# Patient Record
Sex: Female | Born: 1960 | Race: Black or African American | Hispanic: No | Marital: Married | Smoking: Former smoker
Health system: Southern US, Community
[De-identification: ages and names within clinical notes are randomized; demographics above are authoritative.]

## PROBLEM LIST (undated history)

## (undated) DIAGNOSIS — G4730 Sleep apnea, unspecified: Secondary | ICD-10-CM

## (undated) DIAGNOSIS — R7303 Prediabetes: Secondary | ICD-10-CM

## (undated) DIAGNOSIS — M1990 Unspecified osteoarthritis, unspecified site: Secondary | ICD-10-CM

## (undated) DIAGNOSIS — G5600 Carpal tunnel syndrome, unspecified upper limb: Secondary | ICD-10-CM

## (undated) DIAGNOSIS — G8929 Other chronic pain: Secondary | ICD-10-CM

## (undated) DIAGNOSIS — K219 Gastro-esophageal reflux disease without esophagitis: Secondary | ICD-10-CM

## (undated) DIAGNOSIS — M545 Low back pain, unspecified: Secondary | ICD-10-CM

## (undated) DIAGNOSIS — R011 Cardiac murmur, unspecified: Secondary | ICD-10-CM

## (undated) HISTORY — PX: BARIATRIC SURGERY: SHX1103

## (undated) HISTORY — PX: ABDOMINAL HYSTERECTOMY: SHX81

## (undated) HISTORY — PX: CARPAL TUNNEL RELEASE: SHX101

## (undated) HISTORY — PX: TUBAL LIGATION: SHX77

## (undated) HISTORY — PX: BUNIONECTOMY: SHX129

## (undated) HISTORY — PX: REPLACEMENT TOTAL KNEE BILATERAL: SUR1225

## (undated) HISTORY — PX: PARTIAL HYSTERECTOMY: SHX80

---

## 2013-09-21 ENCOUNTER — Emergency Department (HOSPITAL_COMMUNITY)
Admission: EM | Admit: 2013-09-21 | Discharge: 2013-09-21 | Disposition: A | Discharge status: Home / Self Care | Payer: BC Managed Care – PPO | Attending: Emergency Medicine | Admitting: Emergency Medicine

## 2013-09-21 ENCOUNTER — Emergency Department (HOSPITAL_COMMUNITY): Payer: BC Managed Care – PPO

## 2013-09-21 DIAGNOSIS — Z791 Long term (current) use of non-steroidal anti-inflammatories (NSAID): Secondary | ICD-10-CM | POA: Insufficient documentation

## 2013-09-21 DIAGNOSIS — R079 Chest pain, unspecified: Secondary | ICD-10-CM | POA: Diagnosis present

## 2013-09-21 DIAGNOSIS — R51 Headache: Secondary | ICD-10-CM | POA: Diagnosis not present

## 2013-09-21 DIAGNOSIS — Z79899 Other long term (current) drug therapy: Secondary | ICD-10-CM | POA: Diagnosis not present

## 2013-09-21 DIAGNOSIS — R0789 Other chest pain: Secondary | ICD-10-CM | POA: Diagnosis not present

## 2013-09-21 LAB — CBC
HEMATOCRIT: 34.7 % — AB (ref 36.0–46.0)
HEMOGLOBIN: 11.9 g/dL — AB (ref 12.0–15.0)
MCH: 28.5 pg (ref 26.0–34.0)
MCHC: 34.3 g/dL (ref 30.0–36.0)
MCV: 83.2 fL (ref 78.0–100.0)
Platelets: 217 10*3/uL (ref 150–400)
RBC: 4.17 MIL/uL (ref 3.87–5.11)
RDW: 14.6 % (ref 11.5–15.5)
WBC: 14.9 10*3/uL — AB (ref 4.0–10.5)

## 2013-09-21 LAB — COMPREHENSIVE METABOLIC PANEL
ALBUMIN: 3.8 g/dL (ref 3.5–5.2)
ALT: 27 U/L (ref 0–35)
ANION GAP: 8 (ref 5–15)
AST: 26 U/L (ref 0–37)
Alkaline Phosphatase: 100 U/L (ref 39–117)
BILIRUBIN TOTAL: 0.4 mg/dL (ref 0.3–1.2)
BUN: 15 mg/dL (ref 6–23)
CHLORIDE: 105 meq/L (ref 96–112)
CO2: 28 mEq/L (ref 19–32)
Calcium: 9.3 mg/dL (ref 8.4–10.5)
Creatinine, Ser: 0.58 mg/dL (ref 0.50–1.10)
GFR calc non Af Amer: >90 mL/min (ref >90)
GLUCOSE: 106 mg/dL — AB (ref 70–99)
POTASSIUM: 4.2 meq/L (ref 3.7–5.3)
Sodium: 141 mEq/L (ref 137–147)
Total Protein: 7.8 g/dL (ref 6.0–8.3)

## 2013-09-21 LAB — I-STAT TROPONIN, ED: Troponin i, poc: 0 ng/mL (ref 0.00–0.08)

## 2013-09-21 LAB — TROPONIN I: Troponin I: <0.3 ng/mL (ref <0.30)

## 2013-09-21 MED ORDER — MORPHINE SULFATE 4 MG/ML IJ SOLN
4.0000 mg | Freq: Once | INTRAMUSCULAR | Status: AC
  Administered 2013-09-21: 4 mg via INTRAVENOUS
  Filled 2013-09-21: qty 1

## 2013-09-21 MED ORDER — FAMOTIDINE IN NACL 20-0.9 MG/50ML-% IV SOLN
20.0000 mg | Freq: Once | INTRAVENOUS | Status: AC
  Administered 2013-09-21: 20 mg via INTRAVENOUS
  Filled 2013-09-21: qty 50

## 2013-09-21 MED ORDER — ONDANSETRON HCL 4 MG/2ML IJ SOLN
4.0000 mg | Freq: Once | INTRAMUSCULAR | Status: AC
  Administered 2013-09-21: 4 mg via INTRAVENOUS
  Filled 2013-09-21: qty 2

## 2013-09-21 NOTE — ED Provider Notes (Signed)
Care assumed from [place], PA-C at shift change. Pt with chest pain, nausea and diaphoresis, resolved with nitro. Workup negative. Plan to obtain delta troponin at 4:30. If negative, d/c home. 6:18 PM Delta troponin negative. Patient reports her chest pain has not returned. States she is hungry. Stable for discharge. Followup with PCP. Return precautions given. Patient states understanding of treatment care plan and is agreeable.  Results for orders placed during the hospital encounter of 09/21/13  CBC      Result Value Ref Range   WBC 14.9 (*) 4.0 - 10.5 K/uL   RBC 4.17  3.87 - 5.11 MIL/uL   Hemoglobin 11.9 (*) 12.0 - 15.0 g/dL   HCT 08.6 (*) 57.8 - 46.9 %   MCV 83.2  78.0 - 100.0 fL   MCH 28.5  26.0 - 34.0 pg   MCHC 34.3  30.0 - 36.0 g/dL   RDW 62.9  52.8 - 41.3 %   Platelets 217  150 - 400 K/uL  COMPREHENSIVE METABOLIC PANEL      Result Value Ref Range   Sodium 141  137 - 147 mEq/L   Potassium 4.2  3.7 - 5.3 mEq/L   Chloride 105  96 - 112 mEq/L   CO2 28  19 - 32 mEq/L   Glucose, Bld 106 (*) 70 - 99 mg/dL   BUN 15  6 - 23 mg/dL   Creatinine, Ser 2.44  0.50 - 1.10 mg/dL   Calcium 9.3  8.4 - 01.0 mg/dL   Total Protein 7.8  6.0 - 8.3 g/dL   Albumin 3.8  3.5 - 5.2 g/dL   AST 26  0 - 37 U/L   ALT 27  0 - 35 U/L   Alkaline Phosphatase 100  39 - 117 U/L   Total Bilirubin 0.4  0.3 - 1.2 mg/dL   GFR calc non Af Amer >90  >90 mL/min   GFR calc Af Amer >90  >90 mL/min   Anion gap 8  5 - 15  TROPONIN I      Result Value Ref Range   Troponin I <0.30  <0.30 ng/mL  I-STAT TROPOININ, ED      Result Value Ref Range   Troponin i, poc 0.00  0.00 - 0.08 ng/mL   Comment 3              Trevor Mace, [place] 09/21/13 1819

## 2013-09-21 NOTE — ED Provider Notes (Signed)
CSN: [id number]     Arrival date & time 09/21/13  1302 History   First MD Initiated Contact with Patient 09/21/13 1329     Chief Complaint  Patient presents with  . Chest Pain     (Consider location/radiation/quality/duration/timing/severity/associated sxs/prior Treatment) HPI Colleen Petersen Is a 53 year old female who presents to the emergency department with chief complaint of chest pain. The patient states that this morning she had a mild headache. She ate about 8 and a banana she thought her headache might be related to the fact that she hadn't eaten. Patient states that she was driving from [address] mucoid en route and during that time had onset of severe substernal squeezing chest pain which radiated up to her throat. She felt as if she did squeezing in her throat. The patient swallowed, try to find positions of comfort and could not become comfortable. Patient states when she got to nuclear chief in a parking lot. The patient states she became diaphoretic and had one episode of vomiting. RF include obestiy. No other RF.    No past medical history on file. No past surgical history on file. No family history on file. History  Substance Use Topics  . Smoking status: Not on file  . Smokeless tobacco: Not on file  . Alcohol Use: Not on file   OB History   No data available     Review of Systems  Ten systems reviewed and are negative for acute change, except as noted in the HPI.    Allergies  Review of patient's allergies indicates not on file.  Home Medications   Prior to Admission medications   Medication Sig Start Date End Date Taking? Authorizing Provider  hydrOXYzine (ATARAX/VISTARIL) 25 MG tablet Take 25 mg by mouth at bedtime as needed for itching.   Yes Historical Provider, MD  KRILL OIL PO Take 1 tablet by mouth daily.   Yes Historical Provider, MD  meloxicam (MOBIC) 15 MG tablet Take 15 mg by mouth daily.   Yes Historical Provider, MD   There were no  vitals taken for this visit. Physical Exam  Constitutional: She is oriented to person, place, and time. She appears well-developed and well-nourished. No distress.  HENT:  Head: Normocephalic and atraumatic.  Eyes: Conjunctivae are normal. No scleral icterus.  Neck: Normal range of motion.  Cardiovascular: Normal rate, regular rhythm and normal heart sounds.  Exam reveals no gallop and no friction rub.   No murmur heard. Pulmonary/Chest: Effort normal and breath sounds normal. No respiratory distress.  Abdominal: Soft. Bowel sounds are normal. She exhibits no distension and no mass. There is no tenderness. There is no guarding.  Neurological: She is alert and oriented to person, place, and time.  Skin: Skin is warm and dry. She is not diaphoretic.    ED Course  Procedures (including critical care time) Labs Review Labs Reviewed  CBC  COMPREHENSIVE METABOLIC PANEL  I-STAT TROPOININ, ED    Imaging Review No results found.   EKG Interpretation None      MDM   Final diagnoses:  None    Patient with Minimal rf.I feel her symptoms sound more GI in nature. Perhaps, gerd or espohageal spasm I have discssed the case with my attending physcican Dr. Rosalia Hammers who feels that delta troponin is appropriate. I have given report to PA Mathis Fare who will assume care.    Arthor Captain, PA-C 09/21/13 [phone]

## 2013-09-21 NOTE — Discharge Instructions (Signed)

## 2013-09-21 NOTE — ED Notes (Signed)
To ED via [facility] with c/o chest pain that started at approx 1200, pt had a headache last night, went to work today with h/a-- ate breakfast around 1000 thinking it would make her feel better. Had to go to [employer] for work, and while on the way from one to the other, started having mid sternal chest pain. Vomited x 1, became diaphoretic, received  ASA and NTG x 1 with relief of chest pain. Currently has h/a 8/10.

## 2013-09-22 NOTE — ED Provider Notes (Signed)
History/physical exam/procedure(s) were performed by non-physician practitioner and as supervising physician I was immediately available for consultation/collaboration. I have reviewed all notes and am in agreement with care and plan.   Iain Sawchuk S Tifini Reeder, MD 09/22/13 1008 

## 2013-09-22 NOTE — ED Provider Notes (Signed)
Medical screening examination/treatment/procedure(s) were performed by non-physician practitioner and as supervising physician I was immediately available for consultation/collaboration.  Antoneo Ghrist, MD 09/22/13 1008 

## 2016-01-02 ENCOUNTER — Encounter (HOSPITAL_BASED_OUTPATIENT_CLINIC_OR_DEPARTMENT_OTHER): Payer: Self-pay | Admitting: Emergency Medicine

## 2016-01-02 ENCOUNTER — Emergency Department (HOSPITAL_BASED_OUTPATIENT_CLINIC_OR_DEPARTMENT_OTHER)
Admission: EM | Admit: 2016-01-02 | Discharge: 2016-01-02 | Disposition: A | Discharge status: Home / Self Care | Payer: BC Managed Care – PPO | Attending: Emergency Medicine | Admitting: Emergency Medicine

## 2016-01-02 DIAGNOSIS — K1120 Sialoadenitis, unspecified: Secondary | ICD-10-CM | POA: Insufficient documentation

## 2016-01-02 DIAGNOSIS — B349 Viral infection, unspecified: Secondary | ICD-10-CM | POA: Insufficient documentation

## 2016-01-02 DIAGNOSIS — R6884 Jaw pain: Secondary | ICD-10-CM | POA: Diagnosis present

## 2016-01-02 MED ORDER — MELOXICAM 7.5 MG PO TABS: 15.0000 mg | ORAL_TABLET | Freq: Every day | ORAL | 0 refills | Status: DC | PRN

## 2016-01-02 MED ORDER — DEXAMETHASONE 6 MG PO TABS
12.0000 mg | ORAL_TABLET | Freq: Once | ORAL | Status: AC
  Administered 2016-01-02: 12 mg via ORAL
  Filled 2016-01-02: qty 2

## 2016-01-02 MED FILL — MELOXICAM 15 MG TABLET: 15 | 5 days supply | Qty: 5 | Fill #0

## 2016-01-02 NOTE — ED Provider Notes (Signed)
[facility] Provider Note   CSN: [id number] Arrival date & time: 01/02/16  1001  By signing my name below, I, Sonum Patel, attest that this documentation has been prepared under the direction and in the presence of Raeford RazorStephen Terryl Molinelli, MD. Electronically Signed: Sonum Patel, Neurosurgeoncribe. 01/02/16. 10:55 AM.  History   Chief Complaint Chief Complaint  Patient presents with  . Bodyaches    The history is provided by the patient. No language interpreter was used.     HPI Comments: Colleen Petersen is a 55 y.o. female who presents to the Emergency Department complaining of persistent fatigue and generalized body aches for the past 5 days. She was seen by her PCP twice; the first time she was given a steroid injection and started on Cefdinir. Patient returned to her PCP with the same symptoms and also right sided jaw pain and swelling. She was diagnosed with parotitis and was started on Clindamycin and Tamiflu. She states none of these medications have provided relief. She also complains of bilateral leg burning and states at times her legs buckle when walking.  She also complains of abdominal pain. She denies rash, dysuria.   History reviewed. No pertinent past medical history.  There are no active problems to display for this patient.   Past Surgical History:  Procedure Laterality Date  . ABDOMINAL HYSTERECTOMY    . CARPAL TUNNEL RELEASE      OB History    No data available       Home Medications    Prior to Admission medications   Medication Sig Start Date End Date Taking? Authorizing Provider  hydrOXYzine (ATARAX/VISTARIL) 25 MG tablet Take 25 mg by mouth at bedtime as needed for itching.    Historical Provider, MD  KRILL OIL PO Take 1 tablet by mouth daily.    Historical Provider, MD  meloxicam (MOBIC) 15 MG tablet Take 15 mg by mouth daily.    Historical Provider, MD    Family History No family history on file.  Social History Social History  Substance Use Topics   . Smoking status: Never Smoker  . Smokeless tobacco: Never Used  . Alcohol use Yes     Allergies   Patient has no known allergies.   Review of Systems Review of Systems  A complete 10 system review of systems was obtained and all systems are negative except as noted in the HPI and PMH.   Physical Exam Updated Vital Signs BP 133/87 (BP Location: Right Arm)   Pulse 83   Temp 98.7 F (37.1 C) (Oral)   Resp 16   Wt 209 lb (94.8 kg)   SpO2 100%   Physical Exam  Constitutional: She is oriented to person, place, and time. She appears well-developed and well-nourished. No distress.  HENT:  Head: Normocephalic and atraumatic.  Mild fullness and tenderness to right parotid.   Eyes: EOM are normal.  Neck: Normal range of motion.  Cardiovascular: Normal rate, regular rhythm and normal heart sounds.   Pulmonary/Chest: Effort normal and breath sounds normal.  Abdominal: Soft. She exhibits no distension. There is no tenderness.  Musculoskeletal: Normal range of motion.  Neurological: She is alert and oriented to person, place, and time.  Skin: Skin is warm and dry.  Psychiatric: She has a normal mood and affect. Judgment normal.  Nursing note and vitals reviewed.    ED Treatments / Results  DIAGNOSTIC STUDIES: Oxygen Saturation is 100% on RA, normal by my interpretation.    COORDINATION OF CARE:  10:56 AM Discussed treatment plan with pt at bedside and pt agreed to plan.    Labs (all labs ordered are listed, but only abnormal results are displayed) Labs Reviewed - No data to display  EKG  EKG Interpretation None       Radiology No results found.  Procedures Procedures (including critical care time)  Medications Ordered in ED Medications - No data to display   Initial Impression / Assessment and Plan / ED Course  I have reviewed the triage vital signs and the nursing notes.  Pertinent labs & imaging results that were available during my care of the patient  were reviewed by me and considered in my medical decision making (see chart for details).  Clinical Course     Final Clinical Impressions(s) / ED Diagnoses   Final diagnoses:  Viral illness  Parotitis    New Prescriptions New Prescriptions   No medications on file   I personally preformed the services scribed in my presence. The recorded information has been reviewed is accurate. Raeford RazorStephen Arjun Hard, MD.    Raeford RazorStephen Tiann Saha, MD 01/08/16 1318

## 2016-01-02 NOTE — ED Triage Notes (Signed)
Pt reports generalized body aches and swelling x 1 week. Pt seen at doctor x 2 and pt states she was dx with mumps. Pt states she was given 2 abx.

## 2016-11-04 ENCOUNTER — Other Ambulatory Visit (HOSPITAL_COMMUNITY): Payer: Self-pay | Admitting: Surgery

## 2016-11-11 ENCOUNTER — Encounter: Payer: Self-pay | Admitting: Registered"

## 2016-11-11 ENCOUNTER — Encounter: Payer: BC Managed Care – PPO | Attending: Surgery | Admitting: Registered"

## 2016-11-11 DIAGNOSIS — Z713 Dietary counseling and surveillance: Secondary | ICD-10-CM | POA: Diagnosis present

## 2016-11-11 DIAGNOSIS — Z6841 Body Mass Index (BMI) 40.0 and over, adult: Secondary | ICD-10-CM | POA: Insufficient documentation

## 2016-11-11 DIAGNOSIS — E669 Obesity, unspecified: Secondary | ICD-10-CM

## 2016-11-11 NOTE — Progress Notes (Signed)
Pre-Op Assessment Visit:  Pre-Operative RYGB Surgery  Medical Nutrition Therapy:  Appt start time: 8:05  End time:  9:20  Patient was seen on 11/11/2016 for Pre-Operative Nutrition Assessment. Assessment and [facility] on 11/11/2016.   Pt expectation of surgery: improve joint pain in knees and back, wants to lose weight and maintain, prevent back surgery  Pt expectation of Dietitian: education on good items to choose from, do's/don'ts of what to eat and not to eat to help maintain weight loss  Start weight at [facility]: 314.2 BMI: 48.48   Pt states she does not eat beef or pork. Pt states alcohol intake depends on the mood.   Per insurance, pt needs 0 SWL visits prior to surgery.    24 hr Dietary Recall: First Meal: boiled egg, strawberries Snack: none Second Meal: shrimp and grits Snack: 2 mini pack of skittles Third Meal: 2 tacos Snack: none Beverages: coffee, water, alcohol, lemonade  Encouraged to engage in 75 minutes of moderate physical activity including cardiovascular and weight baring weekly  Handouts given during visit include:  . Pre-Op Goals . Bariatric Surgery Protein Shakes . Vitamin and Mineral Recommendations  During the appointment today the following Pre-Op Goals were reviewed with the patient: . Track your food and beverage: MyFitness Pal or Baritastic App . Maintain or lose weight as instructed by your surgeon . Make healthy food choices . Begin to limit portion sizes . Limited concentrated sugars and fried foods . Keep fat/sugar in the single digits per serving on          food labels . Practice CHEWING your food  (aim for 30 chews per bite or until applesauce consistency) . Practice not drinking 15 minutes before, during, and 30 minutes after each meal/snack . Avoid all carbonated beverages  . Avoid/limit caffeinated beverages  . Avoid all sugar-sweetened beverages . Avoid  alcohol . Consume 3 meals per day; eat every 3-5 hours . Make a list of non-food related activities . Aim for 64-100 ounces of FLUID daily  . Aim for at least 60-80 grams of PROTEIN daily . Look for a liquid protein source that contain ?15 g protein and ?5 g carbohydrate  (ex: shakes, drinks, shots) . Physical activity is an important part of a healthy lifestyle so keep it moving!  Follow diet recommendations listed below Energy and Macronutrient Recommendations: Calories: 1600 Carbohydrate: 180 Protein: 120 Fat: 44  Demonstrated degree of understanding via:  Teach Back   Teaching Method Utilized:  Visual Auditory Hands on  Barriers to learning/adherence to lifestyle change: none  Patient to call the [facility] to enroll in Pre-Op and Post-Op Nutrition Education when surgery date is scheduled.

## 2016-12-06 ENCOUNTER — Ambulatory Visit: Payer: BC Managed Care – PPO

## 2016-12-13 ENCOUNTER — Ambulatory Visit: Payer: BC Managed Care – PPO

## 2016-12-14 ENCOUNTER — Ambulatory Visit (HOSPITAL_COMMUNITY): Payer: BC Managed Care – PPO

## 2016-12-14 ENCOUNTER — Ambulatory Visit (HOSPITAL_COMMUNITY): Admission: RE | Admit: 2016-12-14 | Payer: BC Managed Care – PPO | Source: Ambulatory Visit

## 2016-12-14 ENCOUNTER — Encounter (HOSPITAL_COMMUNITY): Payer: Self-pay

## 2016-12-20 ENCOUNTER — Ambulatory Visit: Payer: BC Managed Care – PPO | Admitting: Registered"

## 2016-12-29 ENCOUNTER — Ambulatory Visit (HOSPITAL_COMMUNITY)
Admission: RE | Admit: 2016-12-29 | Discharge: 2016-12-29 | Disposition: A | Discharge status: Home / Self Care | Payer: BC Managed Care – PPO | Source: Ambulatory Visit | Attending: Surgery | Admitting: Surgery

## 2017-01-03 ENCOUNTER — Encounter: Payer: Self-pay | Admitting: Skilled Nursing Facility1

## 2017-01-03 ENCOUNTER — Encounter: Payer: BC Managed Care – PPO | Attending: Surgery | Admitting: Skilled Nursing Facility1

## 2017-01-03 DIAGNOSIS — Z6841 Body Mass Index (BMI) 40.0 and over, adult: Secondary | ICD-10-CM | POA: Insufficient documentation

## 2017-01-03 DIAGNOSIS — Z713 Dietary counseling and surveillance: Secondary | ICD-10-CM | POA: Diagnosis not present

## 2017-01-03 DIAGNOSIS — E669 Obesity, unspecified: Secondary | ICD-10-CM

## 2017-01-03 NOTE — Progress Notes (Signed)
Pre-Operative Nutrition Class:  Appt start time: 9444   End time:  1830.  Patient was seen on 01/03/2017 for Pre-Operative Bariatric Surgery Education at [facility].   Surgery date:  Surgery type: RYGB Start weight at [facility]: 314.2 Weight today: 319.3  Samples given per MNT protocol. Patient educated on appropriate usage: Bariatric Advantage Calcium  Lot # lot: [id number] Exp: sep-08-2017  [place] Multivitamin Lot # [id number] Exp: 07/2018  Renee Pain Protein  Shake Lot # [id number] Exp: may-26-19  The following the learning objectives were met by the patient during this course:  Identify Pre-Op Dietary Goals and will begin 2 weeks pre-operatively  Identify appropriate sources of fluids and proteins   State protein recommendations and appropriate sources pre and post-operatively  Identify Post-Operative Dietary Goals and will follow for 2 weeks post-operatively  Identify appropriate multivitamin and calcium sources  Describe the need for physical activity post-operatively and will follow MD recommendations  State when to call healthcare provider regarding medication questions or post-operative complications  Handouts given during class include:  Pre-Op Bariatric Surgery Diet Handout  Protein Shake Handout  Post-Op Bariatric Surgery Nutrition Handout  [facility]EL[facility] Program Information Flyer  Support Group Information Flyer  [facility] Bariatric Supplements Price List  Follow-Up Plan: [facility] for diet advancement per MD.

## 2017-01-20 ENCOUNTER — Ambulatory Visit: Payer: Self-pay | Admitting: Surgery

## 2017-01-20 NOTE — H&P (Signed)
error 

## 2017-02-03 NOTE — Patient Instructions (Addendum)
Colleen CumminsMarisa Lodes  02/03/2017   Your procedure is scheduled on: 02-08-17    [facility]    Follow signs to Short Stay on first floor at 530 AM  Call this number if you have problems the morning of surgery [phone]     Remember: NO SOLID FOOD AFTER MIDNIGHT THE NIGHT PRIOR TO SURGERY. NOTHING BY MOUTH EXCEPT CLEAR LIQUIDS UNTIL 3 HOURS PRIOR TO SCHEDULED SURGERY. PLEASE FINISH ENSURE DRINK PER SURGEON ORDER 3 HOURS PRIOR TO SCHEDULED SURGERY TIME WHICH NEEDS TO BE COMPLETED AT ____4:30AM____.     Take these medicines the morning of surgery with A SIP OF WATER: omeprazole, pantoprazole                                  You may not have any metal on your body including hair pins and              piercings  Do not wear jewelry, make-up, lotions, powders or perfumes, deodorant             Do not wear nail polish.  Do not shave  48 hours prior to surgery.     Do not bring valuables to the hospital. [facility] IS NOT             RESPONSIBLE   FOR VALUABLES.  Contacts, dentures or bridgework may not be worn into surgery.  Leave suitcase in the car. After surgery it may be brought to your room.   PLEASE BRING CPAP MASK AND TUBING ONLY. DEVICE WILL BE PROVIDED !              Please read over the following fact sheets you were given: _____________________________________________________________________          CLEAR LIQUID DIET   Foods Allowed                                                                     Foods Excluded  Coffee and tea, regular and decaf                             liquids that you cannot  Plain Jell-O in any flavor                                             see through such as: Fruit ices (not with fruit pulp)                                     milk, soups, orange juice  Iced Popsicles                                    All solid food Carbonated beverages, regular and diet  Cranberry, grape and apple juices Sports drinks like Gatorade Lightly seasoned clear broth or consume(fat free) Sugar, honey syrup  Sample Menu Breakfast                                Lunch                                     Supper Cranberry juice                    Beef broth                            Chicken broth Jell-O                                     Grape juice                           Apple juice Coffee or tea                        Jell-O                                      Popsicle                                                Coffee or tea                        Coffee or tea  _____________________________________________________________________  [facility] - Preparing for Surgery Before surgery, you can play an important role.  Because skin is not sterile, your skin needs to be as free of germs as possible.  You can reduce the number of germs on your skin by washing with CHG (chlorahexidine gluconate) soap before surgery.  CHG is an antiseptic cleaner which kills germs and bonds with the skin to continue killing germs even after washing. Please DO NOT use if you have an allergy to CHG or antibacterial soaps.  If your skin becomes reddened/irritated stop using the CHG and inform your nurse when you arrive at Short Stay. Do not shave (including legs and underarms) for at least 48 hours prior to the first CHG shower.  You may shave your face/neck. Please follow these instructions carefully:  1.  Shower with CHG Soap the night before surgery and the  morning of Surgery.  2.  If you choose to wash your hair, wash your hair first as usual with your  normal  shampoo.  3.  After you shampoo, rinse your hair and body thoroughly to remove the  shampoo.                           4.  Use CHG as you would any other liquid soap.  You can apply chg directly  to the skin and wash  Gently with a scrungie or clean washcloth.  5.  Apply the CHG Soap to your body ONLY  FROM THE NECK DOWN.   Do not use on face/ open                           Wound or open sores. Avoid contact with eyes, ears mouth and genitals (private parts).                       Wash face,  Genitals (private parts) with your normal soap.             6.  Wash thoroughly, paying special attention to the area where your surgery  will be performed.  7.  Thoroughly rinse your body with warm water from the neck down.  8.  DO NOT shower/wash with your normal soap after using and rinsing off  the CHG Soap.                9.  Pat yourself dry with a clean towel.            10.  Wear clean pajamas.            11.  Place clean sheets on your bed the night of your first shower and do not  sleep with pets. Day of Surgery : Do not apply any lotions/deodorants the morning of surgery.  Please wear clean clothes to the hospital/surgery center.  FAILURE TO FOLLOW THESE INSTRUCTIONS MAY RESULT IN THE CANCELLATION OF YOUR SURGERY PATIENT SIGNATURE_________________________________  NURSE SIGNATURE__________________________________  ________________________________________________________________________   Adam Phenix  An incentive spirometer is a tool that can help keep your lungs clear and active. This tool measures how well you are filling your lungs with each breath. Taking long deep breaths may help reverse or decrease the chance of developing breathing (pulmonary) problems (especially infection) following:  A long period of time when you are unable to move or be active. BEFORE THE PROCEDURE   If the spirometer includes an indicator to show your best effort, your nurse or respiratory therapist will set it to a desired goal.  If possible, sit up straight or lean slightly forward. Try not to slouch.  Hold the incentive spirometer in an upright position. INSTRUCTIONS FOR USE  1. Sit on the edge of your bed if possible, or sit up as far as you can in bed or on a chair. 2. Hold the incentive  spirometer in an upright position. 3. Breathe out normally. 4. Place the mouthpiece in your mouth and seal your lips tightly around it. 5. Breathe in slowly and as deeply as possible, raising the piston or the ball toward the top of the column. 6. Hold your breath for 3-5 seconds or for as long as possible. Allow the piston or ball to fall to the bottom of the column. 7. Remove the mouthpiece from your mouth and breathe out normally. 8. Rest for a few seconds and repeat Steps 1 through 7 at least 10 times every 1-2 hours when you are awake. Take your time and take a few normal breaths between deep breaths. 9. The spirometer may include an indicator to show your best effort. Use the indicator as a goal to work toward during each repetition. 10. After each set of 10 deep breaths, practice coughing to be sure your lungs are clear. If you have an incision (the cut made at the time of  surgery), support your incision when coughing by placing a pillow or rolled up towels firmly against it. Once you are able to get out of bed, walk around indoors and cough well. You may stop using the incentive spirometer when instructed by your caregiver.  RISKS AND COMPLICATIONS  Take your time so you do not get dizzy or light-headed.  If you are in pain, you may need to take or ask for pain medication before doing incentive spirometry. It is harder to take a deep breath if you are having pain. AFTER USE  Rest and breathe slowly and easily.  It can be helpful to keep track of a log of your progress. Your caregiver can provide you with a simple table to help with this. If you are using the spirometer at home, follow these instructions: [place] IF:   You are having difficultly using the spirometer.  You have trouble using the spirometer as often as instructed.  Your pain medication is not giving enough relief while using the spirometer.  You develop fever of 100.5 F (38.1 C) or higher. SEEK  IMMEDIATE MEDICAL CARE IF:   You cough up bloody sputum that had not been present before.  You develop fever of 102 F (38.9 C) or greater.  You develop worsening pain at or near the incision site. MAKE SURE YOU:   Understand these instructions.  Will watch your condition.  Will get help right away if you are not doing well or get worse. Document Released: 05/03/2006 Document Revised: 03/15/2011 Document Reviewed: 07/04/2006 [facility] Patient Information 2014 [place], [place].   ________________________________________________________________________

## 2017-02-04 ENCOUNTER — Encounter (INDEPENDENT_AMBULATORY_CARE_PROVIDER_SITE_OTHER): Payer: Self-pay

## 2017-02-04 ENCOUNTER — Encounter (HOSPITAL_COMMUNITY)
Admission: RE | Admit: 2017-02-04 | Discharge: 2017-02-04 | Disposition: A | Discharge status: Home / Self Care | Payer: BC Managed Care – PPO | Source: Ambulatory Visit | Attending: Surgery | Admitting: Surgery

## 2017-02-04 ENCOUNTER — Inpatient Hospital Stay (HOSPITAL_COMMUNITY): Admission: RE | Admit: 2017-02-04 | Payer: BC Managed Care – PPO | Source: Ambulatory Visit

## 2017-02-04 ENCOUNTER — Encounter (HOSPITAL_COMMUNITY): Payer: Self-pay

## 2017-02-04 ENCOUNTER — Other Ambulatory Visit: Payer: Self-pay

## 2017-02-04 DIAGNOSIS — Z01810 Encounter for preprocedural cardiovascular examination: Secondary | ICD-10-CM | POA: Diagnosis not present

## 2017-02-04 DIAGNOSIS — Z01812 Encounter for preprocedural laboratory examination: Secondary | ICD-10-CM | POA: Insufficient documentation

## 2017-02-04 DIAGNOSIS — Z6841 Body Mass Index (BMI) 40.0 and over, adult: Secondary | ICD-10-CM | POA: Insufficient documentation

## 2017-02-04 HISTORY — DX: Carpal tunnel syndrome, unspecified upper limb: G56.00

## 2017-02-04 HISTORY — DX: Low back pain, unspecified: M54.50

## 2017-02-04 HISTORY — DX: Sleep apnea, unspecified: G47.30

## 2017-02-04 HISTORY — DX: Gastro-esophageal reflux disease without esophagitis: K21.9

## 2017-02-04 HISTORY — DX: Low back pain: M54.5

## 2017-02-04 HISTORY — DX: Other chronic pain: G89.29

## 2017-02-04 HISTORY — DX: Unspecified osteoarthritis, unspecified site: M19.90

## 2017-02-04 HISTORY — DX: Cardiac murmur, unspecified: R01.1

## 2017-02-04 HISTORY — DX: Prediabetes: R73.03

## 2017-02-04 LAB — HEMOGLOBIN A1C
Hgb A1c MFr Bld: 5.5 % (ref 4.8–5.6)
Mean Plasma Glucose: 111.15 mg/dL

## 2017-02-04 LAB — CBC WITH DIFFERENTIAL/PLATELET
BASOS PCT: 0 %
Basophils Absolute: 0 10*3/uL (ref 0.0–0.1)
EOS ABS: 0.2 10*3/uL (ref 0.0–0.7)
EOS PCT: 3 %
HCT: 35.2 % — ABNORMAL LOW (ref 36.0–46.0)
Hemoglobin: 11.8 g/dL — ABNORMAL LOW (ref 12.0–15.0)
Lymphocytes Relative: 35 %
Lymphs Abs: 2.7 10*3/uL (ref 0.7–4.0)
MCH: 28 pg (ref 26.0–34.0)
MCHC: 33.5 g/dL (ref 30.0–36.0)
MCV: 83.4 fL (ref 78.0–100.0)
MONOS PCT: 7 %
Monocytes Absolute: 0.5 10*3/uL (ref 0.1–1.0)
Neutro Abs: 4.2 10*3/uL (ref 1.7–7.7)
Neutrophils Relative %: 55 %
PLATELETS: 187 10*3/uL (ref 150–400)
RBC: 4.22 MIL/uL (ref 3.87–5.11)
RDW: 14.6 % (ref 11.5–15.5)
WBC: 7.7 10*3/uL (ref 4.0–10.5)

## 2017-02-04 LAB — COMPREHENSIVE METABOLIC PANEL
ALBUMIN: 4 g/dL (ref 3.5–5.0)
ALT: 27 U/L (ref 14–54)
ANION GAP: 6 (ref 5–15)
AST: 30 U/L (ref 15–41)
Alkaline Phosphatase: 80 U/L (ref 38–126)
BILIRUBIN TOTAL: 0.6 mg/dL (ref 0.3–1.2)
BUN: 13 mg/dL (ref 6–20)
CO2: 29 mmol/L (ref 22–32)
Calcium: 9.3 mg/dL (ref 8.9–10.3)
Chloride: 105 mmol/L (ref 101–111)
Creatinine, Ser: 0.6 mg/dL (ref 0.44–1.00)
GFR calc Af Amer: >60 mL/min (ref >60)
GFR calc non Af Amer: >60 mL/min (ref >60)
GLUCOSE: 85 mg/dL (ref 65–99)
POTASSIUM: 4.1 mmol/L (ref 3.5–5.1)
Sodium: 140 mmol/L (ref 135–145)
TOTAL PROTEIN: 7.8 g/dL (ref 6.5–8.1)

## 2017-02-04 LAB — ABO/RH: ABO/RH(D): A POS

## 2017-02-04 NOTE — Progress Notes (Signed)
RN EXPLAINED TO PATIENT NEED FOR EKG TODAY WITH HX OF HEART MURMUR THOUGH NOT SYMPTOMATIC. PATIENT REPORTS SHE HAD EKG DONE WITH PRE-OP FOR SURGEONS OFFICE AS WELL AS CXR AND UPPER GI. SHE REPORTS SHE HAD ALL RECORDS FAXED TO FRANCES AT [facility]. RN CALLED AND SPOKE WITH FRANCES WHO WAS UNABLE TO LOCATE EKG RECORD ALTHOUGH SHE STATES THAT THE EKG WAS ORDERED BY DR Fredricka BonineONNOR. FRANCES DIRECTED RN TO RADIOLOGY ORDER IN Epic MEDIA. FRANCES THEN HAD RN SPEAK WITH [facility] WHO SUGGESTED THAT ORDER MAY HAVE BEEN MISSED WHICH IS WHY IT DOES NOT APPEAR IN Epic LIKE THE CXR AND UPPER GI AND ASK RN TO HAVE PATIENT DO EKG AT THIS PRE-OP APPT BECAUSE " SHE NEEDS IT TO BE DONE BEFORE THE DAY OF SURGERY". RN ASKED [facility] IF SHE COULD MAKE SURE PATIENT WAS NOT CHARGED BY THEIR OFFICE FOR EKG SINCE SHE WILL NOW HAVE IT DONE HERE. [facility] ASSURES THAT SHE WILL LOOK INTO IT TO MAKE SURE. RN MADE PATIENT AWARE OF SITUATION . PATIENT APPEARED UPSET AND [facility]  [facility]. PATIENT WAS ON HOLD WITH [facility] FOR NEARLY 10 MINUTES BEFORE BEING TRANSFERRED TO A VOICEMAIL. PATIENT ENDED THE CALL AND AGREED TO HAVE EKG AND LABS DONE HERE.

## 2017-02-07 NOTE — Anesthesia Preprocedure Evaluation (Addendum)
Anesthesia Evaluation  Patient identified by MRN, date of birth, ID band Patient awake    Reviewed: Allergy & Precautions, NPO status , Patient's Chart, lab work & pertinent test results  Airway Mallampati: III  TM Distance: >3 FB Neck ROM: Full    Dental  (+) Dental Advisory Given   Pulmonary sleep apnea , former smoker,    breath sounds clear to auscultation       Cardiovascular negative cardio ROS   Rhythm:Regular Rate:Normal     Neuro/Psych negative neurological ROS     GI/Hepatic Neg liver ROS, GERD  Medicated,  Endo/Other  Morbid obesity  Renal/GU negative Renal ROS     Musculoskeletal  (+) Arthritis ,   Abdominal   Peds  Hematology   Anesthesia Other Findings   Reproductive/Obstetrics                            Lab Results  Component Value Date   WBC 7.7 02/04/2017   HGB 11.8 (L) 02/04/2017   HCT 35.2 (L) 02/04/2017   MCV 83.4 02/04/2017   PLT 187 02/04/2017   Lab Results  Component Value Date   CREATININE 0.60 02/04/2017   BUN 13 02/04/2017   NA 140 02/04/2017   K 4.1 02/04/2017   CL 105 02/04/2017   CO2 29 02/04/2017    Anesthesia Physical Anesthesia Plan  ASA: III  Anesthesia Plan: General   Post-op Pain Management:    Induction: Intravenous  PONV Risk Score and Plan: 4 or greater and Scopolamine patch - Pre-op, Midazolam, Dexamethasone, Ondansetron and Treatment may vary due to age or medical condition  Airway Management Planned: Oral ETT  Additional Equipment:   Intra-op Plan:   Post-operative Plan: Extubation in OR  Informed Consent: I have reviewed the patients History and Physical, chart, labs and discussed the procedure including the risks, benefits and alternatives for the proposed anesthesia with the patient or authorized representative who has indicated his/her understanding and acceptance.   Dental advisory given  Plan Discussed with:  CRNA  Anesthesia Plan Comments:        Anesthesia Quick Evaluation

## 2017-02-07 NOTE — H&P (Signed)
Colleen Petersen Documented: 01/20/2017 12:06 PM Location: [facility] Patient #: [id number] DOB: Jun 20, 1960 Married / Language: English / Race: Black or African American Female   History of Present Illness (Daylon Lafavor A. Kae Heller MD; 01/20/2017 12:44 PM) Patient words: Returns today for preop appointment. She has completed preoperative workup with no barriers identified. Labs show high cholesterol, slightly low irons vitamin D. Chest x-ray normal, echo shows normal EF, concentric left ventricular hypertrophy. Upper GI is normal. She had a workup at some point for noninfectious arthritis and was found to have an elevated ESR, positive AMA and mildly increased anti-DNA antibodies as well as slightly increased ceruloplasmin. She is a Engineer, maintenance (IT) as well as psychologist and both have deemed her appropriate candidate from their standpoint. She is ready to proceed with surgery. Today the first thing she brings up is her concerns about pain for the surgery and our policy regarding insurance and didn't refill out the [facility] paperwork that she sent over. She is also under the impression that we will give her a month's supply of some sort of supplement, and I had a hard time getting her to understand the rolls of each of the members of our team specifically that the nutritionist when she has a preop appointment will discussed the preoperative diet, the protein shakes, the vitamin supplementation and where to get those materials. We again discussed the surgery in detail, the risks involved including bleeding, infection, pain, scarring, intra-abdominal injury, anastomotic leak, chronic nausea, marginal ulcer, internal hernia, etc. We again discussed the typical hospital stay and postoperative recovery. She had thoughts of insightful questions and these were answered to her satisfaction.  From initial visit 11/04/16: She's been dealing with her weight for most of her adult life but it is exacerbated by  her pregnancies. She has tried innumerable things including some glass, Weight Watchers, appetite suppressants, dietary counseling, exercise regimens, and has not really had any success with weight loss. She is interested in the gastric bypass given her history of reflux. She is prediabetic and has a strong family history of diabetes as well.  She works as an Estate agent at [employer], which involves a lot of sitting at [employer].  The patient is a 57 year old female who presents for a bariatric surgery evaluation. Associated symptoms include joint pains, back pain and heartburn. Initial onset of obesity was after pregnancy. Initial presentation included inability to lose weight. Disease complications include hypertension, sleep apnea (On CPAP) and other (Osteoarthritis of both knees, chronic back injury), while disease complications do not include diabetes (She is prediabetic). Current diet includes well balanced meals. less than once per week. Limitations to weight loss are sedentary lifestyle. Pertinent family history includes obesity and diabetes. The patient is currently able to do activities of daily living without limitations, able to work without limitations and able to do housework without limitations. Past treatment has included food diary, low calorie diet, appetite suppressants, lipase inhibitors and weight loss group. Surgical history: C-section - elective and hysterectomy. Cardiac history: The patient has sleep apnea, and patient denies anti-coagulants or smoking. Gastrointestinal History: Patient has heartburn (Takes omeprazole every day). MBSQIP recognized comorbidities: Patient has sleep apnea and gastroesophageal reflux disease.  The patient is a 57 year old female.   Allergies Alean Rinne, [place]; 01/20/2017 12:07 PM) No Known Drug Allergies [11/04/2016]: Allergies Reconciled   Medication History Alean Rinne, [place]; 01/20/2017 12:07 PM) Diclofenac Sodium (75MG Tablet DR, Oral)  Active. Meloxicam (15MG Tablet, Oral) Active. Estradiol (0.5MG Tablet,  Oral) Active. Gabapentin (100MG Capsule, Oral) Active. Omeprazole (40MG Capsule DR, Oral) Active. Saxenda (18MG/3ML Soln Pen-inj, Subcutaneous) Active. Glucosamine Chondroitin (Oral) Active. Medications Reconciled  Vitals Alean Rinne RMA; 01/20/2017 12:07 PM) 01/20/2017 12:06 PM Weight: 324.6 lb Height: 68in Body Surface Area: 2.51 m Body Mass Index: 49.35 kg/m  Temp.: 98.83F  Pulse: 98 (Regular)  BP: 135/82 (Sitting, Left Arm, Standard)       Physical Exam (Tremar Wickens A. Kae Heller MD; 01/20/2017 12:44 PM) The physical exam findings are as follows: Note:Gen: A&Ox3, no distress Head: normocephalic, atraumatic, Eyes: extraocular motions intact, anicteric. Neck: supple without mass or thyromegaly Chest: unlabored respirations, symmetrical air entry, clear bilaterally Cardiovascular: RRR with palpable distal pulses, no pedal edema Abdomen: soft, nondistended, nontender. No mass or organomegaly. Extremities: warm, without edema, no deformities Neuro: grossly intact, normal gait Psych: appropriate mood and affect, normal insight Skin: warm and dry    Assessment & Plan (Melisia Leming A. Kae Heller MD; 01/20/2017 12:45 PM) MORBID OBESITY (E66.01) Story: She is ready to proceed with surgery, Roux-en-Y gastric bypass pending insurance approval. We discussed the technique of gastric bypass and the risks of bleeding, infection, pain, scarring, anastomotic leak and sequelae, chronic abdominal pain or nausea, internal hernia, as well as the risks of general anesthesia including blood clots, stroke, heart attack, pneumonia and death. She expressed understanding she had several questions all of which were answered to her satisfaction. I referred her to our coordinator for further discussion of the logistics and payment questions that she brought up at the beginning of the visit. Current Plans Started OxyCODONE HCl 5  MG/5ML Oral Solution, 5-10 Milliliter every four hours, as needed, 100 Milliliter, 01/20/2017, No Refill. Started Protonix 40 MG Oral Tablet Delayed Release, 1 (one) Tablet daily, #30, 30 days starting 01/20/2017, Ref. x3. Started Zofran 4 MG Oral Tablet, 1 (one) Tablet every six hours, as needed for nausea, #20, 01/20/2017, No Refill.

## 2017-02-08 ENCOUNTER — Inpatient Hospital Stay (HOSPITAL_COMMUNITY)
Admission: RE | Admit: 2017-02-08 | Discharge: 2017-02-10 | DRG: 621 | Disposition: A | Discharge status: Home / Self Care | Payer: BC Managed Care – PPO | Source: Ambulatory Visit | Attending: Surgery | Admitting: Surgery

## 2017-02-08 ENCOUNTER — Inpatient Hospital Stay (HOSPITAL_COMMUNITY): Payer: BC Managed Care – PPO | Admitting: Anesthesiology

## 2017-02-08 ENCOUNTER — Encounter (HOSPITAL_COMMUNITY): Payer: Self-pay | Admitting: Certified Registered Nurse Anesthetist

## 2017-02-08 ENCOUNTER — Other Ambulatory Visit: Payer: Self-pay

## 2017-02-08 ENCOUNTER — Encounter (HOSPITAL_COMMUNITY)
Admission: RE | Disposition: A | Discharge status: Home / Self Care | Payer: Self-pay | Source: Ambulatory Visit | Attending: Surgery

## 2017-02-08 DIAGNOSIS — E7800 Pure hypercholesterolemia, unspecified: Secondary | ICD-10-CM | POA: Diagnosis present

## 2017-02-08 DIAGNOSIS — G4733 Obstructive sleep apnea (adult) (pediatric): Secondary | ICD-10-CM | POA: Diagnosis present

## 2017-02-08 DIAGNOSIS — Z90710 Acquired absence of both cervix and uterus: Secondary | ICD-10-CM

## 2017-02-08 DIAGNOSIS — Z87891 Personal history of nicotine dependence: Secondary | ICD-10-CM

## 2017-02-08 DIAGNOSIS — R7303 Prediabetes: Secondary | ICD-10-CM | POA: Diagnosis present

## 2017-02-08 DIAGNOSIS — I10 Essential (primary) hypertension: Secondary | ICD-10-CM | POA: Diagnosis present

## 2017-02-08 DIAGNOSIS — Z6841 Body Mass Index (BMI) 40.0 and over, adult: Secondary | ICD-10-CM | POA: Diagnosis not present

## 2017-02-08 DIAGNOSIS — Z833 Family history of diabetes mellitus: Secondary | ICD-10-CM

## 2017-02-08 DIAGNOSIS — K219 Gastro-esophageal reflux disease without esophagitis: Secondary | ICD-10-CM | POA: Diagnosis present

## 2017-02-08 DIAGNOSIS — K449 Diaphragmatic hernia without obstruction or gangrene: Secondary | ICD-10-CM | POA: Diagnosis present

## 2017-02-08 HISTORY — PX: GASTRIC ROUX-EN-Y: SHX5262

## 2017-02-08 LAB — TYPE AND SCREEN
ABO/RH(D): A POS
Antibody Screen: NEGATIVE

## 2017-02-08 SURGERY — LAPAROSCOPIC ROUX-EN-Y GASTRIC BYPASS WITH UPPER ENDOSCOPY
Anesthesia: General | Site: Abdomen

## 2017-02-08 MED ORDER — EVICEL 5 ML EX KIT
PACK | Freq: Once | CUTANEOUS | Status: AC
  Administered 2017-02-08: 5 mL
  Filled 2017-02-08: qty 1

## 2017-02-08 MED ORDER — SODIUM CHLORIDE 0.9 % IV SOLN
INTRAVENOUS | Status: DC
  Administered 2017-02-08 – 2017-02-09 (×3): via INTRAVENOUS
  Administered 2017-02-10: 125 mL/h via INTRAVENOUS

## 2017-02-08 MED ORDER — SUGAMMADEX SODIUM 500 MG/5ML IV SOLN
INTRAVENOUS | Status: AC
  Filled 2017-02-08: qty 5

## 2017-02-08 MED ORDER — BUPIVACAINE LIPOSOME 1.3 % IJ SUSP
20.0000 mL | Freq: Once | INTRAMUSCULAR | Status: AC
  Administered 2017-02-08: 20 mL
  Filled 2017-02-08: qty 20

## 2017-02-08 MED ORDER — PROMETHAZINE HCL 25 MG/ML IJ SOLN
6.2500 mg | INTRAMUSCULAR | Status: DC | PRN
Start: 2017-02-08 — End: 2017-02-08

## 2017-02-08 MED ORDER — OXYCODONE HCL 5 MG/5ML PO SOLN: 5.0000 mg | ORAL | Status: DC | PRN

## 2017-02-08 MED ORDER — FENTANYL CITRATE (PF) 250 MCG/5ML IJ SOLN
INTRAMUSCULAR | Status: DC | PRN
  Administered 2017-02-08: 150 ug via INTRAVENOUS
  Administered 2017-02-08: 50 ug via INTRAVENOUS
  Administered 2017-02-08: 100 ug via INTRAVENOUS
  Administered 2017-02-08: 50 ug via INTRAVENOUS

## 2017-02-08 MED ORDER — KETAMINE HCL 10 MG/ML IJ SOLN
INTRAMUSCULAR | Status: DC | PRN
  Administered 2017-02-08: 50 mg via INTRAVENOUS

## 2017-02-08 MED ORDER — METOPROLOL TARTRATE 5 MG/5ML IV SOLN: 5.0000 mg | Freq: Four times a day (QID) | INTRAVENOUS | Status: DC | PRN

## 2017-02-08 MED ORDER — DIPHENHYDRAMINE HCL 50 MG/ML IJ SOLN: 12.5000 mg | Freq: Four times a day (QID) | INTRAMUSCULAR | Status: DC | PRN

## 2017-02-08 MED ORDER — SCOPOLAMINE 1 MG/3DAYS TD PT72
1.0000 | MEDICATED_PATCH | TRANSDERMAL | Status: DC
  Administered 2017-02-08: 1.5 mg via TRANSDERMAL
  Filled 2017-02-08: qty 1

## 2017-02-08 MED ORDER — CELECOXIB 200 MG PO CAPS
400.0000 mg | ORAL_CAPSULE | ORAL | Status: AC
  Administered 2017-02-08: 400 mg via ORAL
  Filled 2017-02-08 (×2): qty 2

## 2017-02-08 MED ORDER — CEFOTETAN DISODIUM-DEXTROSE 2-2.08 GM-%(50ML) IV SOLR
2.0000 g | INTRAVENOUS | Status: AC
  Administered 2017-02-08: 2 g via INTRAVENOUS
  Filled 2017-02-08: qty 50

## 2017-02-08 MED ORDER — CHLORHEXIDINE GLUCONATE 4 % EX LIQD: 60.0000 mL | Freq: Once | CUTANEOUS | Status: DC

## 2017-02-08 MED ORDER — KETAMINE HCL 10 MG/ML IJ SOLN
INTRAMUSCULAR | Status: AC
  Filled 2017-02-08: qty 1

## 2017-02-08 MED ORDER — LIDOCAINE 2% (20 MG/ML) 5 ML SYRINGE
INTRAMUSCULAR | Status: AC
  Filled 2017-02-08: qty 10

## 2017-02-08 MED ORDER — PROPOFOL 10 MG/ML IV BOLUS
INTRAVENOUS | Status: AC
  Filled 2017-02-08: qty 20

## 2017-02-08 MED ORDER — ACETAMINOPHEN 160 MG/5ML PO SOLN
650.0000 mg | ORAL | Status: DC | PRN
  Administered 2017-02-08 – 2017-02-09 (×3): 650 mg via ORAL
  Filled 2017-02-08 (×4): qty 20.3

## 2017-02-08 MED ORDER — DEXAMETHASONE SODIUM PHOSPHATE 10 MG/ML IJ SOLN
INTRAMUSCULAR | Status: DC | PRN
  Administered 2017-02-08: 10 mg via INTRAVENOUS

## 2017-02-08 MED ORDER — APREPITANT 40 MG PO CAPS
40.0000 mg | ORAL_CAPSULE | ORAL | Status: AC
  Administered 2017-02-08: 40 mg via ORAL
  Filled 2017-02-08: qty 1

## 2017-02-08 MED ORDER — BUPIVACAINE-EPINEPHRINE 0.25% -1:200000 IJ SOLN
INTRAMUSCULAR | Status: AC
  Filled 2017-02-08: qty 1

## 2017-02-08 MED ORDER — FENTANYL CITRATE (PF) 100 MCG/2ML IJ SOLN
INTRAMUSCULAR | Status: AC
  Filled 2017-02-08: qty 2

## 2017-02-08 MED ORDER — PANTOPRAZOLE SODIUM 40 MG IV SOLR
40.0000 mg | Freq: Every day | INTRAVENOUS | Status: DC
  Administered 2017-02-08 – 2017-02-09 (×2): 40 mg via INTRAVENOUS
  Filled 2017-02-08 (×2): qty 40

## 2017-02-08 MED ORDER — LACTATED RINGERS IR SOLN
Status: DC | PRN
  Administered 2017-02-08: 3000 mL

## 2017-02-08 MED ORDER — HYDROMORPHONE HCL 1 MG/ML IJ SOLN: 0.5000 mg | INTRAMUSCULAR | Status: DC | PRN

## 2017-02-08 MED ORDER — BUPIVACAINE-EPINEPHRINE 0.25% -1:200000 IJ SOLN
INTRAMUSCULAR | Status: DC | PRN
  Administered 2017-02-08: 30 mL

## 2017-02-08 MED ORDER — DEXTROSE 5 % IV SOLN
500.0000 mg | Freq: Four times a day (QID) | INTRAVENOUS | Status: DC | PRN
  Administered 2017-02-08: 500 mg via INTRAVENOUS
  Filled 2017-02-08: qty 550

## 2017-02-08 MED ORDER — PREMIER PROTEIN SHAKE
2.0000 [oz_av] | ORAL | Status: DC
  Administered 2017-02-09 – 2017-02-10 (×6): 2 [oz_av] via ORAL

## 2017-02-08 MED ORDER — GLYCOPYRROLATE 0.2 MG/ML IV SOSY
PREFILLED_SYRINGE | INTRAVENOUS | Status: DC | PRN
  Administered 2017-02-08: .2 mg via INTRAVENOUS

## 2017-02-08 MED ORDER — ROCURONIUM BROMIDE 10 MG/ML (PF) SYRINGE
PREFILLED_SYRINGE | INTRAVENOUS | Status: DC | PRN
  Administered 2017-02-08: 20 mg via INTRAVENOUS
  Administered 2017-02-08: 10 mg via INTRAVENOUS
  Administered 2017-02-08: 60 mg via INTRAVENOUS
  Administered 2017-02-08: 20 mg via INTRAVENOUS

## 2017-02-08 MED ORDER — EPHEDRINE SULFATE-NACL 50-0.9 MG/10ML-% IV SOSY
PREFILLED_SYRINGE | INTRAVENOUS | Status: DC | PRN
  Administered 2017-02-08: 15 mg via INTRAVENOUS
  Administered 2017-02-08: 10 mg via INTRAVENOUS

## 2017-02-08 MED ORDER — MIDAZOLAM HCL 5 MG/5ML IJ SOLN
INTRAMUSCULAR | Status: DC | PRN
  Administered 2017-02-08: 2 mg via INTRAVENOUS

## 2017-02-08 MED ORDER — LACTATED RINGERS IV SOLN
INTRAVENOUS | Status: DC | PRN
  Administered 2017-02-08: 07:00:00 via INTRAVENOUS

## 2017-02-08 MED ORDER — ONDANSETRON HCL 4 MG/2ML IJ SOLN
INTRAMUSCULAR | Status: AC
  Filled 2017-02-08: qty 2

## 2017-02-08 MED ORDER — ROCURONIUM BROMIDE 10 MG/ML (PF) SYRINGE
PREFILLED_SYRINGE | INTRAVENOUS | Status: AC
  Filled 2017-02-08: qty 5

## 2017-02-08 MED ORDER — ENOXAPARIN SODIUM 40 MG/0.4ML ~~LOC~~ SOLN
40.0000 mg | SUBCUTANEOUS | Status: AC
  Administered 2017-02-08: 40 mg via SUBCUTANEOUS
  Filled 2017-02-08: qty 0.4

## 2017-02-08 MED ORDER — SUGAMMADEX SODIUM 500 MG/5ML IV SOLN
INTRAVENOUS | Status: DC | PRN
  Administered 2017-02-08: 300 mg via INTRAVENOUS

## 2017-02-08 MED ORDER — SIMETHICONE 80 MG PO CHEW: 80.0000 mg | CHEWABLE_TABLET | Freq: Four times a day (QID) | ORAL | Status: DC | PRN

## 2017-02-08 MED ORDER — PROPOFOL 10 MG/ML IV BOLUS
INTRAVENOUS | Status: DC | PRN
  Administered 2017-02-08: 200 mg via INTRAVENOUS

## 2017-02-08 MED ORDER — LIDOCAINE 2% (20 MG/ML) 5 ML SYRINGE
INTRAMUSCULAR | Status: DC | PRN
  Administered 2017-02-08: 1.5 mg/kg/h via INTRAVENOUS

## 2017-02-08 MED ORDER — GABAPENTIN 300 MG PO CAPS
300.0000 mg | ORAL_CAPSULE | ORAL | Status: AC
  Administered 2017-02-08: 300 mg via ORAL
  Filled 2017-02-08: qty 1

## 2017-02-08 MED ORDER — GABAPENTIN 250 MG/5ML PO SOLN
200.0000 mg | Freq: Two times a day (BID) | ORAL | Status: DC
  Administered 2017-02-08 – 2017-02-10 (×4): 200 mg via ORAL
  Filled 2017-02-08 (×4): qty 4

## 2017-02-08 MED ORDER — MIDAZOLAM HCL 2 MG/2ML IJ SOLN
INTRAMUSCULAR | Status: AC
  Filled 2017-02-08: qty 2

## 2017-02-08 MED ORDER — FENTANYL CITRATE (PF) 100 MCG/2ML IJ SOLN
INTRAMUSCULAR | Status: AC
  Filled 2017-02-08: qty 4

## 2017-02-08 MED ORDER — ONDANSETRON HCL 4 MG/2ML IJ SOLN: 4.0000 mg | INTRAMUSCULAR | Status: DC | PRN

## 2017-02-08 MED ORDER — ENOXAPARIN SODIUM 30 MG/0.3ML ~~LOC~~ SOLN
30.0000 mg | Freq: Two times a day (BID) | SUBCUTANEOUS | Status: DC
  Administered 2017-02-08 – 2017-02-10 (×4): 30 mg via SUBCUTANEOUS
  Filled 2017-02-08 (×4): qty 0.3

## 2017-02-08 MED ORDER — FENTANYL CITRATE (PF) 100 MCG/2ML IJ SOLN
25.0000 ug | INTRAMUSCULAR | Status: DC | PRN
  Administered 2017-02-08: 50 ug via INTRAVENOUS
  Administered 2017-02-08: 25 ug via INTRAVENOUS

## 2017-02-08 MED ORDER — HYDRALAZINE HCL 20 MG/ML IJ SOLN: 10.0000 mg | INTRAMUSCULAR | Status: DC | PRN

## 2017-02-08 MED ORDER — DEXAMETHASONE SODIUM PHOSPHATE 4 MG/ML IJ SOLN: 4.0000 mg | INTRAMUSCULAR | Status: DC

## 2017-02-08 MED ORDER — ONDANSETRON HCL 4 MG/2ML IJ SOLN
INTRAMUSCULAR | Status: DC | PRN
  Administered 2017-02-08: 4 mg via INTRAVENOUS

## 2017-02-08 MED ORDER — FENTANYL CITRATE (PF) 250 MCG/5ML IJ SOLN
INTRAMUSCULAR | Status: AC
  Filled 2017-02-08: qty 5

## 2017-02-08 MED ORDER — ACETAMINOPHEN 500 MG PO TABS
1000.0000 mg | ORAL_TABLET | ORAL | Status: AC
  Administered 2017-02-08: 1000 mg via ORAL
  Filled 2017-02-08: qty 2

## 2017-02-08 MED ORDER — LIDOCAINE 2% (20 MG/ML) 5 ML SYRINGE
INTRAMUSCULAR | Status: DC | PRN
  Administered 2017-02-08: 60 mg via INTRAVENOUS

## 2017-02-08 SURGICAL SUPPLY — 70 items
APPLICATOR COTTON TIP 6IN STRL (MISCELLANEOUS) ×6 IMPLANT
APPLIER CLIP ROT 13.4 12 LRG (CLIP)
BENZOIN TINCTURE PRP APPL 2/3 (GAUZE/BANDAGES/DRESSINGS) ×3 IMPLANT
BLADE SURG SZ11 CARB STEEL (BLADE) ×3 IMPLANT
CABLE HIGH FREQUENCY MONO STRZ (ELECTRODE) ×3 IMPLANT
CHLORAPREP W/TINT 26ML (MISCELLANEOUS) ×6 IMPLANT
CLIP APPLIE ROT 13.4 12 LRG (CLIP) IMPLANT
CLIP SUT LAPRA TY ABSORB (SUTURE) ×6 IMPLANT
CLOSURE WOUND 1/2 X4 (GAUZE/BANDAGES/DRESSINGS) ×1
COVER SURGICAL LIGHT HANDLE (MISCELLANEOUS) ×3 IMPLANT
DERMABOND ADVANCED (GAUZE/BANDAGES/DRESSINGS) ×2
DERMABOND ADVANCED .7 DNX12 (GAUZE/BANDAGES/DRESSINGS) ×1 IMPLANT
DEVICE SUT QUICK LOAD TK 5 (STAPLE) ×2 IMPLANT
DEVICE SUT TI-KNOT TK 5X26 (MISCELLANEOUS) ×2 IMPLANT
DEVICE SUTURE ENDOST 10MM (ENDOMECHANICALS) ×3 IMPLANT
DEVICE TI KNOT TK5 (MISCELLANEOUS) ×1
DRAIN PENROSE 18X1/4 LTX STRL (WOUND CARE) ×3 IMPLANT
ELECT REM PT RETURN 15FT ADLT (MISCELLANEOUS) ×3 IMPLANT
GAUZE SPONGE 4X4 12PLY STRL (GAUZE/BANDAGES/DRESSINGS) ×3 IMPLANT
GAUZE SPONGE 4X4 16PLY XRAY LF (GAUZE/BANDAGES/DRESSINGS) ×3 IMPLANT
GLOVE BIO SURGEON STRL SZ 6 (GLOVE) ×3 IMPLANT
GLOVE INDICATOR 6.5 STRL GRN (GLOVE) ×3 IMPLANT
GOWN STRL REUS W/TWL LRG LVL3 (GOWN DISPOSABLE) ×3 IMPLANT
GOWN STRL REUS W/TWL XL LVL3 (GOWN DISPOSABLE) ×9 IMPLANT
HOVERMATT SINGLE USE (MISCELLANEOUS) ×3 IMPLANT
KIT BASIN OR (CUSTOM PROCEDURE TRAY) ×3 IMPLANT
KIT GASTRIC LAVAGE 34FR ADT (SET/KITS/TRAYS/PACK) ×3 IMPLANT
LUBRICANT JELLY K Y 4OZ (MISCELLANEOUS) ×3 IMPLANT
MARKER SKIN DUAL TIP RULER LAB (MISCELLANEOUS) ×3 IMPLANT
NEEDLE SPNL 22GX3.5 QUINCKE BK (NEEDLE) ×3 IMPLANT
PACK CARDIOVASCULAR III (CUSTOM PROCEDURE TRAY) ×3 IMPLANT
QUICK LOAD TK 5 (STAPLE) ×1
RELOAD ENDO STITCH 2.0 (ENDOMECHANICALS) ×24
RELOAD STAPLER BLUE 60MM (STAPLE) ×3 IMPLANT
RELOAD STAPLER GOLD 60MM (STAPLE) ×1 IMPLANT
RELOAD STAPLER WHITE 60MM (STAPLE) ×1 IMPLANT
SCISSORS METZENBAUM CVD 44CM (INSTRUMENTS) ×3 IMPLANT
SHEARS HARMONIC ACE PLUS 45CM (MISCELLANEOUS) ×3 IMPLANT
SLEEVE ADV FIXATION 12X100MM (TROCAR) ×3 IMPLANT
SLEEVE ADV FIXATION 5X100MM (TROCAR) ×6 IMPLANT
SLEEVE XCEL OPT CAN 5 100 (ENDOMECHANICALS) ×3 IMPLANT
SOLUTION ANTI FOG 6CC (MISCELLANEOUS) ×3 IMPLANT
STAPLER ECHELON BIOABSB 60 FLE (MISCELLANEOUS) IMPLANT
STAPLER ECHELON LONG 60 440 (INSTRUMENTS) ×3 IMPLANT
STAPLER RELOAD BLUE 60MM (STAPLE) ×9
STAPLER RELOAD GOLD 60MM (STAPLE) ×3
STAPLER RELOAD WHITE 60MM (STAPLE) ×3
STRIP CLOSURE SKIN 1/2X4 (GAUZE/BANDAGES/DRESSINGS) ×2 IMPLANT
SUT MNCRL AB 4-0 PS2 18 (SUTURE) ×3 IMPLANT
SUT RELOAD ENDO STITCH 2 48X1 (ENDOMECHANICALS) ×8
SUT RELOAD ENDO STITCH 2.0 (ENDOMECHANICALS) ×4
SUT SURGIDAC NAB ES-9 0 48 120 (SUTURE) ×6 IMPLANT
SUT VIC AB 2-0 SH 27 (SUTURE) ×2
SUT VIC AB 2-0 SH 27X BRD (SUTURE) ×1 IMPLANT
SUTURE RELOAD END STTCH 2 48X1 (ENDOMECHANICALS) ×8 IMPLANT
SUTURE RELOAD ENDO STITCH 2.0 (ENDOMECHANICALS) ×4 IMPLANT
SYR 10ML ECCENTRIC (SYRINGE) ×3 IMPLANT
SYR 20CC LL (SYRINGE) ×6 IMPLANT
TIP RIGID 35CM EVICEL (HEMOSTASIS) ×3 IMPLANT
TOWEL OR 17X26 10 PK STRL BLUE (TOWEL DISPOSABLE) ×3 IMPLANT
TOWEL OR NON WOVEN STRL DISP B (DISPOSABLE) ×3 IMPLANT
TRAY FOLEY W/METER SILVER 16FR (SET/KITS/TRAYS/PACK) ×3 IMPLANT
TROCAR ADV FIXATION 12X100MM (TROCAR) ×3 IMPLANT
TROCAR ADV FIXATION 5X100MM (TROCAR) ×3 IMPLANT
TROCAR BLADELESS OPT 5 100 (ENDOMECHANICALS) ×3 IMPLANT
TROCAR XCEL 12X100 BLDLESS (ENDOMECHANICALS) ×3 IMPLANT
TUBING CONNECTING 10 (TUBING) ×4 IMPLANT
TUBING CONNECTING 10' (TUBING) ×2
TUBING ENDO SMARTCAP PENTAX (MISCELLANEOUS) ×3 IMPLANT
TUBING INSUF HEATED (TUBING) ×3 IMPLANT

## 2017-02-08 NOTE — Transfer of Care (Signed)
Immediate Anesthesia Transfer of Care Note  Patient: Colleen CumminsMarisa Petersen  Procedure(s) Performed: LAPAROSCOPIC ROUX-EN-Y GASTRIC BYPASS WITH UPPER ENDOSCOPY (N/A Abdomen)  Patient Location: PACU  Anesthesia Type:General  Level of Consciousness: awake, alert  and oriented  Airway & Oxygen Therapy: Patient Spontanous Breathing and Patient connected to face mask oxygen  Post-op Assessment: Report given to RN and Post -op Vital signs reviewed and stable  Post vital signs: Reviewed and stable  Last Vitals:  Vitals:   02/08/17 0613  BP: 116/70  Pulse: 68  Resp: 18  Temp: 36.6 C  SpO2: 99%    Last Pain:  Vitals:   02/08/17 0613  TempSrc: Oral         Complications: No apparent anesthesia complications

## 2017-02-08 NOTE — Anesthesia Postprocedure Evaluation (Signed)
Anesthesia Post Note  Patient: Colleen CumminsMarisa Petersen  Procedure(s) Performed: LAPAROSCOPIC ROUX-EN-Y GASTRIC BYPASS WITH UPPER ENDOSCOPY (N/A Abdomen)     Patient location during evaluation: PACU Anesthesia Type: General Level of consciousness: awake and alert Pain management: pain level controlled Vital Signs Assessment: post-procedure vital signs reviewed and stable Respiratory status: spontaneous breathing, nonlabored ventilation, respiratory function stable and patient connected to nasal cannula oxygen Cardiovascular status: blood pressure returned to baseline and stable Postop Assessment: no apparent nausea or vomiting Anesthetic complications: no    Last Vitals:  Vitals:   02/08/17 1500 02/08/17 1600  BP: (!) 143/86 (!) 140/93  Pulse: 92 85  Resp: 18 18  Temp: (!) 36.4 C 36.6 C  SpO2: 95% 96%    Last Pain:  Vitals:   02/08/17 1500  TempSrc:   PainSc: 3                  Kennieth RadFitzgerald, Chinenye Katzenberger E

## 2017-02-08 NOTE — Progress Notes (Signed)
Napping. Pain control OK. Doing well with sips. Has walked twice. VSS.

## 2017-02-08 NOTE — Anesthesia Procedure Notes (Signed)
Procedure Name: Intubation Date/Time: 02/08/2017 7:34 AM Performed by: [place] Tinzlee Craker C, CRNA Pre-anesthesia Checklist: Patient identified, Emergency Drugs available, Suction available and Patient being monitored Patient Re-evaluated:Patient Re-evaluated prior to induction Oxygen Delivery Method: Circle system utilized Preoxygenation: Pre-oxygenation with 100% oxygen Induction Type: IV induction Ventilation: Mask ventilation without difficulty Laryngoscope Size: Mac and 3 Grade View: Grade II Tube type: Oral Tube size: 7.0 mm Number of attempts: 1 Airway Equipment and Method: Stylet and Oral airway Placement Confirmation: ETT inserted through vocal cords under direct vision,  positive ETCO2 and breath sounds checked- equal and bilateral Secured at: 20 cm Tube secured with: Tape Dental Injury: Teeth and Oropharynx as per pre-operative assessment

## 2017-02-08 NOTE — Op Note (Signed)
Operative Note  Colleen Petersen  [id number]  [id number]  02/08/2017   Surgeon: Colleen Deutscher ConnorMD  Assistant: Jaclynn Guarneri MD  Procedure performed: laparoscopic antecolic antegastric roux en y gastric bypass, hiatal hernia repair, upper endoscopy  Preop diagnosis: Morbid obesity Body mass index is 48.93 kg/m., obstructive sleep apnea, pre-diabetes, GERD, arthritis, low back pain, heart murmur Post-op diagnosis/intraop findings: same  Specimens: none Retained items: none EBL: 30cc Complications: none  Description of procedure: After obtaining informed consent and administration of prophylactic lovenox in holding, the patient was taken to the operating room and placed supine on operating room table wheregeneral endotracheal anesthesia was initiated, preoperative antibiotics were administered, SCDs applied, and a formal timeout was performed. The abdomen was prepped and draped in usual sterile fashion. Peritoneal access was gained using a Visiport technique in the left upper quadrant and insufflation to 15 mmHg ensued without issue. Gross inspection revealed no evidence of injury. Under direct visualization, bilateral medial 12 mm trochars and bilateral lateral 5 mm trochars were introduced. She had omental adhesions to the low midline incision and these were taken down with blunt dissection and harmonic scalpel. The omentum and transverse colon were then reflected cephalad. The ligament of Treitz was identified and 40 cm was measured distal to this at which point the small bowel was divided with a white load linear cutting stapler. The Penrose was sutured to the Roux limb for future identification. 100 cm was then measured distally along the Roux limb and this site was chosen for our jejunojejunostomy. A silk suture was used to approximate the site to the staple line on the biliopancreatic limb. Enterotomies were made in each with the Harmonic scalpel and then the jejunojejunostomy was  created with a white load 60mm linear cutting stapler. The silk stay suture was removed. The common enterotomy was then closed with running 3-0 Vicryl starting at each end and tying in the middle. The mesenteric defect was closed with a running silk suture. Eviseal then used to coat the jejunojejunostomy. The omentum was then divided along the midline and the patient placed in steep reverse Trendelenburg. A liver retractor was introduced through a subxiphoid incision for fixed retraction of the left lobe. She had a hiatal hernia which was evident on initial inspection, although her preoperative upper GI did not reveal this. The pars lucida was thus entered and the posterior crura dissected out. The hiatus was narrowed with 2 sutures of Surgidac tied with ty-knots. We then measured proximally 5 cm from the GE junction and began the perigastric dissection to enter the lesser sac. The angle of His was dissected away from the diaphragm slightly. We confirmed nothing in the stomach. First using a gold load followed by a blue loads, the gastric pouch was then created, excluding the fundus. The Roux limb was then located and brought up to approximate the pouch ensuring no twisting of the mesentery. A posterior row of running Vicryl was used to secure the Roux limb to the pouch. The Ewald tube was then introduced and came down easily. A gastrotomy and jejunotomy were made with the Harmonic scalpel and the gastrojejunostomy was created with a blue load linear cutting stapler creating an approximately 2-1/2 cm anastomosis. The common enterotomy was closed with running Vicryl suture starting at either end and tying in the middle. The Ewald tube was then brought back down and directed through the anastomosis. An anterior layer of running Vicryl secured with Lapra-Ty's was then used to reinforce the anastomosis. Ewald tube was  removed. Vonita Mosseterson space was identified and closed with a figure-of-eight suture of silk secured with  Lapra-Ty's. At this point with a bowel clamp on the Roux limb, Dr. Johna SheriffHoxworth performed an upper endoscopy, please see his separate note. The anastomosis appeared patent and hemostatic and no leak was identified. The remainder of the Eviseal was injected along the anterior aspect of the GJ. The liver retractor was removed under direct visualization. The abdomen was again surveyed and found to be without any evidence of injury or bleeding. The abdomen was then desufflated and all remaining trochars removed. Local was infiltrated around each incision. The skin incisions were closed with running subcuticular Monocryl; benzoin, Steri-Strips and Band-Aids were applied. The patient was then awakened, extubated and taken to PACU in stable condition.    All counts were correct at the completion of the case.

## 2017-02-08 NOTE — Interval H&P Note (Signed)
History and Physical Interval Note:  02/08/2017 7:09 AM  Colleen CumminsMarisa Hertzberg  has presented today for surgery, with the diagnosis of Morbid Obesity, GERD, OSA  The various methods of treatment have been discussed with the patient and family. After consideration of risks, benefits and other options for treatment, the patient has consented to  Procedure(s): LAPAROSCOPIC ROUX-EN-Y GASTRIC BYPASS WITH UPPER ENDOSCOPY (N/A) as a surgical intervention .  The patient's history has been reviewed, patient examined, no change in status, stable for surgery.  I have reviewed the patient's chart and labs.  Questions were answered to the patient's satisfaction.     [name]

## 2017-02-08 NOTE — Discharge Instructions (Signed)
° ° ° °GASTRIC BYPASS/SLEEVE ° Home Care Instructions ° ° These instructions are to help you care for yourself when you go home. ° °Call: If you have any problems. °• Call [phone] and ask for the surgeon on call °• If you need immediate help, come to the ER at [facility].  °• Tell the ER staff that you are a new post-op gastric bypass or gastric sleeve patient °  °Signs and symptoms to report: • Severe vomiting or nausea °o If you cannot keep down clear liquids for longer than 1 day, call your surgeon  °• Abdominal pain that does not get better after taking your pain medication °• Fever over 100.4° F with chills °• Heart beating over 100 beats a minute °• Shortness of breath at rest °• Chest pain °•  Redness, swelling, drainage, or foul odor at incision (surgical) sites °•  If your incisions open or pull apart °• Swelling or pain in calf (lower leg) °• Diarrhea (Loose bowel movements that happen often), frequent watery, uncontrolled bowel movements °• Constipation, (no bowel movements for 3 days) if this happens: Pick one °o Milk of Magnesia, 2 tablespoons by mouth, 3 times a day for 2 days if needed °o Stop taking Milk of Magnesia once you have a bowel movement °o Call your doctor if constipation continues °Or °o Miralax  (instead of Milk of Magnesia) following the label instructions °o Stop taking Miralax once you have a bowel movement °o Call your doctor if constipation continues °• Anything you think is not normal °  °Normal side effects after surgery: • Unable to sleep at night or unable to focus °• Irritability or moody °• Being tearful (crying) or depressed °These are common complaints, possibly related to your anesthesia medications that put you to sleep, stress of surgery, and change in lifestyle.  This usually goes away a few weeks after surgery.  If these feelings continue, call your primary care doctor. °  °Wound Care: You may have surgical glue, steri-strips, or staples over your incisions after  surgery °• Surgical glue:  Looks like a clear film over your incisions and will wear off a little at a time °• Steri-strips: Strips of tape over your incisions. You may notice a yellowish color on the skin under the steri-strips. This is used to make the   steri-strips stick better. Do not pull the steri-strips off - let them fall off °• Staples: Staples may be removed before you leave the hospital °o If you go home with staples, call [facility], [phone] at for an appointment with your surgeon’s nurse to have staples removed 10 days after surgery. °• Showering: You may shower two (2) days after your surgery unless your surgeon tells you differently °o Wash gently around incisions with warm soapy water, rinse well, and gently pat dry  °o No tub baths until staples are removed, steri-strips fall off or glue is gone.  °  °Medications: • Medications should be liquid or crushed if larger than the size of a dime °• Extended release pills (medication that release a little bit at a time through the day) should NOT be crushed or cut. (examples include XL, ER, DR, SR) °• Depending on the size and number of medications you take, you may need to space (take a few throughout the day)/change the time you take your medications so that you do not over-fill your pouch (smaller stomach) °• Make sure you follow-up with your primary care doctor to   make medication changes needed during rapid weight loss and life-style changes °• If you have diabetes, follow up with the doctor that orders your diabetes medication(s) within one week after surgery and check your blood sugar regularly. °• Do not drive while taking prescription pain medication  °• It is ok to take Tylenol by the bottle instructions with your pain medicine or instead of your pain medicine as needed.  DO NOT TAKE NSAIDS (EXAMPLES OF NSAIDS:  IBUPROFREN/ NAPROXEN)  °Diet:                    First 2 Weeks ° You will see the dietician t about two (2) weeks  after your surgery. The dietician will increase the types of foods you can eat if you are handling liquids well: °• If you have severe vomiting or nausea and cannot keep down clear liquids lasting longer than 1 day, call your surgeon @ ([phone]) °Protein Shake °• Drink at least 2 ounces of shake 5-6 times per day °• Each serving of protein shakes (usually 8 - 12 ounces) should have: °o 15 grams of protein  °o And no more than 5 grams of carbohydrate  °• Goal for protein each day: °o Men = 80 grams per day °o Women = 60 grams per day °• Protein powder may be added to fluids such as non-fat milk or Lactaid milk or unsweetened Soy/Almond milk (limit to 35 grams added protein powder per serving) ° °Hydration °• Slowly increase the amount of water and other clear liquids as tolerated (See Acceptable Fluids) °• Slowly increase the amount of protein shake as tolerated  °•  Sip fluids slowly and throughout the day.  Do not use straws. °• May use sugar substitutes in small amounts (no more than 6 - 8 packets per day; i.e. Splenda) ° °Fluid Goal °• The first goal is to drink at least 8 ounces of protein shake/drink per day (or as directed by the nutritionist); some examples of protein shakes are Syntrax Nectar, Adkins Advantage, EAS Edge HP, and Unjury. See handout from pre-op Bariatric Education Class: °o Slowly increase the amount of protein shake you drink as tolerated °o You may find it easier to slowly sip shakes throughout the day °o It is important to get your proteins in first °• Your fluid goal is to drink 64 - 100 ounces of fluid daily °o It may take a few weeks to build up to this °• 32 oz (or more) should be clear liquids  °And  °• 32 oz (or more) should be full liquids (see below for examples) °• Liquids should not contain sugar, caffeine, or carbonation ° °Clear Liquids: °• Water or Sugar-free flavored water (i.e. Fruit H2O, Propel) °• Decaffeinated coffee or tea (sugar-free) °• Crystal Lite, Wyler’s Lite,  Minute Maid Lite °• Sugar-free Jell-O °• Bouillon or broth °• Sugar-free Popsicle:   *Less than 20 calories each; Limit 1 per day ° °Full Liquids: °Protein Shakes/Drinks + 2 choices per day of other full liquids °• Full liquids must be: °o No More Than 15 grams of Carbs per serving  °o No More Than 3 grams of Fat per serving °• Strained low-fat cream soup (except Cream of Potato or Tomato) °• Non-Fat milk °• Fat-free Lactaid Milk °• Unsweetened Soy Or Unsweetened Almond Milk °• Low Sugar yogurt (Dannon Lite & Fit, Greek yogurt; Oikos Triple Zero; Chobani Simply 100; Yoplait 100 calorie Greek - No Fruit on the Bottom) ° °  °Vitamins   and Minerals • Start 1 day after surgery unless otherwise directed by your surgeon °• 2 Chewable Bariatric Specific Multivitamin / Multimineral Supplement with iron (Example: Bariatric Advantage Multi EA) °• Chewable Calcium with Vitamin D-3 °(Example: 3 Chewable Calcium Plus 600 with Vitamin D-3) °o Take 500 mg three (3) times a day for a total of 1500 mg each day °o Do not take all 3 doses of calcium at one time as it may cause constipation, and you can only absorb 500 mg  at a time  °o Do not mix multivitamins containing iron with calcium supplements; take 2 hours apart °• Menstruating women and those with a history of anemia (a blood disease that causes weakness) may need extra iron °o Talk with your doctor to see if you need more iron °• Do not stop taking or change any vitamins or minerals until you talk to your dietitian or surgeon °• Your Dietitian and/or surgeon must approve all vitamin and mineral supplements °  °Activity and Exercise: Limit your physical activity as instructed by your doctor.  It is important to continue walking at home.  During this time, use these guidelines: °• Do not lift anything greater than ten (10) pounds for at least two (2) weeks °• Do not go back to work or drive until your surgeon says you can °• You may have sex when you feel comfortable  °o It is  VERY important for female patients to use a reliable birth control method; fertility often increases after surgery  °o All hormonal birth control will be ineffective for 30 days after surgery due to medications given during surgery a barrier method must be used. °o Do not get pregnant for at least 18 months °• Start exercising as soon as your doctor tells you that you can °o Make sure your doctor approves any physical activity °• Start with a simple walking program °• Walk 5-15 minutes each day, 7 days per week.  °• Slowly increase until you are walking 30-45 minutes per day °Consider joining our BELT program. [phone] or email [email or web address] °  °Special Instructions Things to remember: °• Use your CPAP when sleeping if this applies to you ° °• [facility] has two free Bariatric Surgery Support Groups that meet monthly °o The 3rd Thursday of each month, 6 pm, [facility]  °o The 2nd Friday of each month, 11:45 am in the private dining room in the basement of [facility] °• It is very important to keep all follow up appointments with your surgeon, dietitian, primary care physician, and behavioral health practitioner °• Routine follow up schedule with your surgeon include appointments at 2-3 weeks, 6-8 weeks, 6 months, and 1 year at a minimum.  Your surgeon may request to see you more often.   °o After the first year, please follow up with your bariatric surgeon and dietitian at least once a year in order to maintain best weight loss results °[facility]: [phone] °[facility]: [phone] °Bariatric Nurse Coordinator: [phone] °  °   Reviewed and Endorsed  °by [facility] Patient Education Committee, June, 2016 °Edits Approved: Aug, 2018 ° ° ° °

## 2017-02-08 NOTE — Progress Notes (Signed)
Discussed post op day goals with patient including ambulation, IS, diet progression, pain, and nausea control. Fluids started patient sitting in chair. Questions answered.

## 2017-02-08 NOTE — Progress Notes (Signed)
Pt able to tolerate ambulation, has voided, and vital signs stable. Pt feels thirsty. 2oz of water given to pt at bedside. Instructed to take small sips at a time and have all ice chips dissolve prior to swallowing. Will continue to monitor.  Sherron MondayGood, Damaso Laday L

## 2017-02-09 ENCOUNTER — Encounter (HOSPITAL_COMMUNITY): Payer: Self-pay | Admitting: Surgery

## 2017-02-09 LAB — CBC WITH DIFFERENTIAL/PLATELET
BASOS PCT: 0 %
Basophils Absolute: 0 10*3/uL (ref 0.0–0.1)
Eosinophils Absolute: 0 10*3/uL (ref 0.0–0.7)
Eosinophils Relative: 0 %
HEMATOCRIT: 32.5 % — AB (ref 36.0–46.0)
HEMOGLOBIN: 11 g/dL — AB (ref 12.0–15.0)
LYMPHS PCT: 18 %
Lymphs Abs: 1.9 10*3/uL (ref 0.7–4.0)
MCH: 27.7 pg (ref 26.0–34.0)
MCHC: 33.8 g/dL (ref 30.0–36.0)
MCV: 81.9 fL (ref 78.0–100.0)
MONO ABS: 0.9 10*3/uL (ref 0.1–1.0)
MONOS PCT: 8 %
NEUTROS ABS: 8.2 10*3/uL — AB (ref 1.7–7.7)
NEUTROS PCT: 74 %
Platelets: 202 10*3/uL (ref 150–400)
RBC: 3.97 MIL/uL (ref 3.87–5.11)
RDW: 14.4 % (ref 11.5–15.5)
WBC: 11 10*3/uL — ABNORMAL HIGH (ref 4.0–10.5)

## 2017-02-09 LAB — COMPREHENSIVE METABOLIC PANEL
ALBUMIN: 3.4 g/dL — AB (ref 3.5–5.0)
ALK PHOS: 65 U/L (ref 38–126)
ALT: 92 U/L — ABNORMAL HIGH (ref 14–54)
ANION GAP: 6 (ref 5–15)
AST: 93 U/L — AB (ref 15–41)
BILIRUBIN TOTAL: 0.6 mg/dL (ref 0.3–1.2)
BUN: 12 mg/dL (ref 6–20)
CALCIUM: 8.6 mg/dL — AB (ref 8.9–10.3)
CO2: 27 mmol/L (ref 22–32)
Chloride: 106 mmol/L (ref 101–111)
Creatinine, Ser: 0.53 mg/dL (ref 0.44–1.00)
GFR calc Af Amer: >60 mL/min (ref >60)
GLUCOSE: 119 mg/dL — AB (ref 65–99)
Potassium: 3.8 mmol/L (ref 3.5–5.1)
Sodium: 139 mmol/L (ref 135–145)
TOTAL PROTEIN: 6.9 g/dL (ref 6.5–8.1)

## 2017-02-09 MED ORDER — OXYCODONE HCL 5 MG/5ML PO SOLN
5.0000 mg | Freq: Four times a day (QID) | ORAL | Status: DC | PRN
  Administered 2017-02-09 (×2): 5 mg via ORAL
  Filled 2017-02-09 (×2): qty 5

## 2017-02-09 MED ORDER — HYDROMORPHONE HCL 1 MG/ML IJ SOLN: 0.5000 mg | Freq: Four times a day (QID) | INTRAMUSCULAR | Status: DC | PRN

## 2017-02-09 MED ORDER — DOCUSATE SODIUM 100 MG PO CAPS
100.0000 mg | ORAL_CAPSULE | Freq: Two times a day (BID) | ORAL | Status: DC
  Administered 2017-02-09 – 2017-02-10 (×3): 100 mg via ORAL
  Filled 2017-02-09 (×3): qty 1

## 2017-02-09 NOTE — Progress Notes (Signed)
Patient alert and oriented, Post op day 1.  Provided support and encouragement.  Encouraged pulmonary toilet, ambulation and small sips of liquids.  Patient completed 12 ounces of bari clear fluid and began protein shakes.  We discussed sitting upright when drinking and the "cramping" feeling she gets with drinking fluids.  All questions answered.  Will continue to monitor.

## 2017-02-09 NOTE — Plan of Care (Addendum)
Nutrition Education Note   Received consult for diet education per DROP protocol.    Discussed 2 week post op diet with pt. Emphasized that liquids must be non carbonated, non caffeinated, and sugar free. Fluid goals discussed. Pt to follow up with outpatient bariatric RD for further diet progression after 2 weeks. Multivitamins and minerals also reviewed. Teach back method used, pt expressed understanding, expect good compliance.     Diet: First 2 Weeks  You will see the nutritionist about two (2) weeks after your surgery. The nutritionist will increase the types of foods you can eat if you are handling liquids well:  If you have severe vomiting or nausea and cannot handle clear liquids lasting longer than 1 day, call your surgeon  Protein Shake  Drink at least 2 ounces of shake 5-6 times per day  Each serving of protein shakes (usually 8 - 12 ounces) should have a minimum of:  15 grams of protein  And no more than 5 grams of carbohydrate  Goal for protein each day:  Men = 80 grams per day  Women = 60 grams per day  Protein powder may be added to fluids such as non-fat milk or Lactaid milk or Soy milk (limit to 35 grams added protein powder per serving)   Hydration  Slowly increase the amount of water and other clear liquids as tolerated (See Acceptable Fluids)  Slowly increase the amount of protein shake as tolerated  Sip fluids slowly and throughout the day  May use sugar substitutes in small amounts (no more than 6 - 8 packets per day; i.e. Splenda)   Fluid Goal  The first goal is to drink at least 8 ounces of protein shake/drink per day (or as directed by the nutritionist); some examples of protein shakes are Premier Protein, ITT IndustriesSyntrax Nectar, Dillard'sdkins Advantage, EAS Edge HP, and Unjury. See handout from pre-op Bariatric Education Class:  Slowly increase the amount of protein shake you drink as tolerated  You may find it easier to slowly sip shakes throughout the day  It is important  to get your proteins in first  Your fluid goal is to drink 64 - 100 ounces of fluid daily  It may take a few weeks to build up to this  32 oz (or more) should be clear liquids  And  32 oz (or more) should be full liquids (see below for examples)  Liquids should not contain sugar, caffeine, or carbonation   Clear Liquids:  Water or Sugar-free flavored water (i.e. Fruit H2O, Propel)  Decaffeinated coffee or tea (sugar-free)  Crystal Lite, Wyler's Lite, Minute Maid Lite  Sugar-free Jell-O  Bouillon or broth  Sugar-free Popsicle: *Less than 20 calories each; Limit 1 per day   Full Liquids:  Protein Shakes/Drinks + 2 choices per day of other full liquids  Full liquids must be:  No More Than 12 grams of Carbs per serving  No More Than 3 grams of Fat per serving  Strained low-fat cream soup  Non-Fat milk  Fat-free Lactaid Milk  Sugar-free yogurt (Dannon Lite & Fit, AustriaGreek yogurt, Oikos Zero)   April Sansevieri, [place], Dietetic Intern Pager # [phone]   RD present for education. Agree with education provided.  Tilda FrancoLindsey Annely Sliva, MS, RD, LDN [facility] Clinical Dietitian Pager: [phone] After Hours Pager: [phone]

## 2017-02-09 NOTE — Progress Notes (Signed)
S: Slept OK. Used CPAP overnight. No nausea or reflux. Has some crampy pain with oral intake but progressing well with fluids. Walked 3 times yesterday. Received 1 dose robaxin, 2 doses tylenol, scheduled gabapentin, no antiemetics, oxycodone or dilaudid.  Vitals, labs, intake/output, and orders reviewed at this time. Tmax 98.6, HR 70-95, Normo- to hypertensive, sats 95% room air. CMP unremarkable, CBC with slight increased wbc to 11 ( 7.7 preop, anticipated), stable hgb 11 (11.8 preop).  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    Phylliss Blakeshelsea Chiquetta Langner, MD [facility], [place] Pager [phone]

## 2017-02-10 LAB — CBC WITH DIFFERENTIAL/PLATELET
Basophils Absolute: 0 10*3/uL (ref 0.0–0.1)
Basophils Relative: 0 %
EOS ABS: 0.1 10*3/uL (ref 0.0–0.7)
EOS PCT: 1 %
HCT: 31.2 % — ABNORMAL LOW (ref 36.0–46.0)
Hemoglobin: 10.5 g/dL — ABNORMAL LOW (ref 12.0–15.0)
LYMPHS ABS: 4.6 10*3/uL — AB (ref 0.7–4.0)
Lymphocytes Relative: 42 %
MCH: 28 pg (ref 26.0–34.0)
MCHC: 33.7 g/dL (ref 30.0–36.0)
MCV: 83.2 fL (ref 78.0–100.0)
Monocytes Absolute: 0.7 10*3/uL (ref 0.1–1.0)
Monocytes Relative: 7 %
Neutro Abs: 5.4 10*3/uL (ref 1.7–7.7)
Neutrophils Relative %: 50 %
PLATELETS: 164 10*3/uL (ref 150–400)
RBC: 3.75 MIL/uL — AB (ref 3.87–5.11)
RDW: 14.7 % (ref 11.5–15.5)
WBC: 10.8 10*3/uL — AB (ref 4.0–10.5)

## 2017-02-10 MED ORDER — OXYCODONE HCL 5 MG/5ML PO SOLN: 5.0000 mg | Freq: Four times a day (QID) | ORAL | 0 refills | Status: AC | PRN

## 2017-02-10 MED ORDER — DOCUSATE SODIUM 100 MG PO CAPS: 100.0000 mg | ORAL_CAPSULE | Freq: Two times a day (BID) | ORAL | 0 refills | Status: AC

## 2017-02-10 NOTE — Op Note (Addendum)
Upper GI endoscopy is performed at the completion of laparoscopic Roux-en-Y gastric bypass by Dr. Fredricka Bonineonnor. The Olympus video endoscope was inserted into the upper esophagus and then passed under direct vision to the EG junction. The small gastric pouch was insufflated with air while the gastric outlet was clamped under irrigation by the operating surgeon. There was no evidence of leak. The anastomosis was visualized and was patent. Suture and staple lines were intact and without bleeding. The pouch was tubular and measured 4-5 cm in length. At the completion of the procedure the pouch was desufflated and the scope withdrawn.

## 2017-02-10 NOTE — Progress Notes (Signed)
S: No new issues, still some pain as liquids go down. No nausea or sticking. No abdominal pain. Walked 8 laps yesterday. Received gabapentin scheduled, 2 doses of oxycodone, one dose of tylenol, no dilaudid or robaxin.   Vitals, labs, intake/output, and orders reviewed at this time. Afebrile, HR 60-68, Normotensive, sats 96% room air. hgb 10.5 (11.0), WBC 10.8 (11). 510 PO intake recorded, UOP 2950.   Gen: A&Ox3, no distress  H&N: EOMI, atraumatic, neck supple Chest: unlabored respirations, RRR Abd: soft, not tender, nondistended, incisions c/d/i. Early bruising present around the 12mm trocar sites, no induration, hematoma, tenderness or warmth.  Ext: warm, no edema Neuro: grossly normal  Lines/tubes/drains: PIV  A/P:  POD 2 roux en y gastric bypass, continues to progress well -Continue liquids as tolerated -Continue to ambulate and out of bed as much as possible during the day, continue aggressive pulmonary toilet -Continue lovenox and SCDs while in bad for DVT ppx, continue IV PPI while inpatient for GI ppx  Plan discharge home today. Advised to continue colace at home and to continue scheduled PPI as prescribed.     Colleen Blakeshelsea Terre Hanneman, MD [facility], [place] Pager [phone]

## 2017-02-10 NOTE — Progress Notes (Signed)
Discharge and medication instructions reviewed with patient; questions answered and patient denies further questions. No prescriptions given to patient. Patient has called someone to come and pick her up. Patient is fully dressed and packed and denies assistance preparing herself for discharge.  Explained to patient that when Alvis LemmingsDawn has finished with her instructions, she will be taken to waiting area to wait for ride. Apologized to patient profusely when she stated "I wish you had told me sooner". Explained to her that I was not aware until now. Lina SarBeth Deari Sessler, RN

## 2017-02-10 NOTE — Progress Notes (Signed)
Patient alert and oriented, Post op day 2.  Provided support and encouragement.  Encouraged pulmonary toilet, ambulation and small sips of liquids.  Patient to discharge requested [facility] to do post op education when family present at 2912.  Bedside RN made aware of request.

## 2017-02-10 NOTE — Progress Notes (Signed)
Patient alert and oriented, pain is controlled. Patient is tolerating fluids, advanced to protein shake today, patient is tolerating well. Reviewed Gastric Bypass discharge instructions with patient and patient is able to articulate understanding. Provided information on BELT program, Support Group and [facility] outpatient pharmacy. All questions answered, will continue to monitor.    

## 2017-02-10 NOTE — Discharge Summary (Signed)
Physician Discharge Summary  Colleen CumminsMarisa Petersen ZOX:[id number] DOB: November 25, 1960 DOA: 02/08/2017  PCP: Caffie DammeSmith, Karla, MD  Admit date: 02/08/2017 Discharge date: 02/10/2017   Recommendations for Outpatient Follow-up:    Follow-up Information    Berna Bueonnor, Brasen Bundren A, MD. Go on 03/10/2017.   Specialty:  General Surgery Why:  at 1215.  Please arrive 15 minutes early for your appointment. Contact information: [phone]        Berna Bueonnor, Corie Vavra A, MD Follow up.   Specialty:  General Surgery Contact information: [phone]          Discharge Diagnoses:  Active Problems:   Morbid obesity [facility](HCC)   Surgical Procedure: Laparoscopic Roux-en-Y gastric bypass, upper endoscopy  Discharge Condition: Good Disposition: Home  Diet recommendation: Postoperative gastric bypass diet  Filed Weights   02/08/17 0613 02/09/17 0527 02/10/17 0521  Weight: (!) 146 kg (321 lb 12.8 oz) (!) 151.6 kg (334 lb 3.5 oz) (!) 150.8 kg (332 lb 7.3 oz)     Hospital Course:  The patient was admitted for a planned laparoscopic Roux-en-Y gastric bypass. Please see operative note. Preoperatively the patient was given lovenox for DVT prophylaxis. ERAS protocol was used. Postoperative prophylactic Lovenox dosing was started on the evening of postoperative day 0.  The patient was started on ice chips and water on the evening of POD 0 which they tolerated. On postoperative day 1 The patient's diet was advanced to protein shakes which they also tolerated. On POD 2, The patient was ambulating without difficulty. Their vital signs are stable without fever or tachycardia. Their hemoglobin had remained stable. The patient was maintained on their home settings for CPAP therapy. The patient had received discharge instructions and counseling. They were deemed stable for discharge.  BP 118/61 (BP Location: Right Arm)   Pulse 66   Temp 98.7 F (37.1 C) (Oral)   Resp 17   Ht 5\' 8"  (1.727 m)   Wt (!) 150.8 kg (332 lb 7.3 oz)   SpO2  96%   BMI 50.55 kg/m   Gen: alert, NAD, non-toxic appearing Pupils: equal, no scleral icterus Pulm: Lungs clear to auscultation, symmetric chest rise CV: regular rate and rhythm Abd: soft, min tender, nondistended. No cellulitis. No incisional hernia Ext: no edema, no calf tenderness Skin: no rash, no jaundice  Discharge Instructions  Discharge Instructions    Ambulate hourly while awake   Complete by:  As directed    Call MD for:  difficulty breathing, headache or visual disturbances   Complete by:  As directed    Call MD for:  persistant dizziness or light-headedness   Complete by:  As directed    Call MD for:  persistant nausea and vomiting   Complete by:  As directed    Call MD for:  redness, tenderness, or signs of infection (pain, swelling, redness, odor or green/yellow discharge around incision site)   Complete by:  As directed    Call MD for:  severe uncontrolled pain   Complete by:  As directed    Call MD for:  temperature >101 F   Complete by:  As directed    Incentive spirometry   Complete by:  As directed    Perform hourly while awake     Allergies as of 02/10/2017      Reactions   Codeine Itching   Itchiness       Medication List    STOP taking these medications   omeprazole 40 MG capsule Commonly known as:  PRILOSEC  TAKE these medications   docusate sodium 100 MG capsule Commonly known as:  COLACE Take 1 capsule (100 mg total) by mouth 2 (two) times daily.   estradiol 0.5 MG tablet Commonly known as:  ESTRACE Take 0.5 mg by mouth daily.   Fish Oil 1200 MG Caps Take 2,400 mg by mouth daily.   gabapentin 100 MG capsule Commonly known as:  NEURONTIN Take 100 mg by mouth at bedtime.   GLUCOSAMINE 1500 COMPLEX PO Take 2 tablets by mouth daily.   hydrOXYzine 25 MG tablet Commonly known as:  ATARAX/VISTARIL Take 25 mg by mouth at bedtime as needed for itching.   multivitamin with minerals Tabs tablet Take 2 tablets by mouth daily.    ondansetron 4 MG tablet Commonly known as:  ZOFRAN Take 4 mg by mouth every 8 (eight) hours as needed for nausea or vomiting.   oxyCODONE 5 MG/5ML solution Commonly known as:  ROXICODONE Take 5 mLs (5 mg total) by mouth every 6 (six) hours as needed for severe pain (please offer non-narcotic medications first).   pantoprazole 40 MG tablet Commonly known as:  PROTONIX Take 40 mg by mouth daily.   Vitamin D3 3000 units Tabs Take 3,000 Units by mouth daily.      Follow-up Information    Berna Bue, MD. Go on 03/10/2017.   Specialty:  General Surgery Why:  at 1215.  Please arrive 15 minutes early for your appointment. Contact information: [phone]        Berna Bue, MD Follow up.   Specialty:  General Surgery Contact information: [phone]            The results of significant diagnostics from this hospitalization (including imaging, microbiology, ancillary and laboratory) are listed below for reference.    Significant Diagnostic Studies: No results found.  Labs: Basic Metabolic Panel: Recent Labs  Lab 02/04/17 0906 02/09/17 0516  NA 140 139  K 4.1 3.8  CL 105 106  CO2 29 27  GLUCOSE 85 119*  BUN 13 12  CREATININE 0.60 0.53  CALCIUM 9.3 8.6*   Liver Function Tests: Recent Labs  Lab 02/04/17 0906 02/09/17 0516  AST 30 93*  ALT 27 92*  ALKPHOS 80 65  BILITOT 0.6 0.6  PROT 7.8 6.9  ALBUMIN 4.0 3.4*    CBC: Recent Labs  Lab 02/04/17 0906 02/09/17 0516 02/10/17 0540  WBC 7.7 11.0* 10.8*  NEUTROABS 4.2 8.2* 5.4  HGB 11.8* 11.0* 10.5*  HCT 35.2* 32.5* 31.2*  MCV 83.4 81.9 83.2  PLT 187 202 164    CBG: No results for input(s): GLUCAP in the last 168 hours.  Active Problems:   Morbid obesity [facility])   Signed:  Berna Bue MD [facility], [place] [phone] 02/10/2017, 8:34 AM

## 2017-02-14 ENCOUNTER — Telehealth (HOSPITAL_COMMUNITY): Payer: Self-pay

## 2017-02-14 NOTE — Telephone Encounter (Signed)
Patient called to discuss post bariatric surgery follow up questions.  See below:   1.  Tell me about your pain and pain management?has not really taken pain medication  2.  Let's talk about fluid intake.  How much total fluid are you taking in?64 ounces  3.  How much protein have you taken in the last 2 days?60 grams of protein, cold temp fluids have been a problem,  We discussed unflavored protein powder  4.  Have you had nausea?  Tell me about when have experienced nausea and what you did to help?yes taken nausea medication that has helped  5.  Has the frequency or color changed with your urine?frequent urination, light in color  6.  Tell me what your incisions look like?incisions look good  7.  Have you been passing gas? BM?yes had BM  8.  If a problem or question were to arise who would you call?  Do you know contact numbers for [facility], [facility], and [facility]?yes knows how to contact all services  9.  How has the walking going?walking regularly and every hour  10.  How are your vitamins and calcium going?  How are you taking them?using celebrate vitamins without difficulty.  Patient on the correct schedule

## 2017-02-22 ENCOUNTER — Encounter: Payer: BC Managed Care – PPO | Attending: Surgery | Admitting: Registered"

## 2017-02-22 DIAGNOSIS — E669 Obesity, unspecified: Secondary | ICD-10-CM

## 2017-02-22 DIAGNOSIS — Z6841 Body Mass Index (BMI) 40.0 and over, adult: Secondary | ICD-10-CM | POA: Diagnosis not present

## 2017-02-22 DIAGNOSIS — Z713 Dietary counseling and surveillance: Secondary | ICD-10-CM | POA: Diagnosis not present

## 2017-02-22 NOTE — Progress Notes (Signed)
Bariatric Class:  Appt start time: 1530 end time:  1630.  2 Week Post-Operative Nutrition Class  Patient was seen on 02/22/2017 for Post-Operative Nutrition education at [facility].   Surgery date: 02/08/2017 Surgery type: RYGB Start weight at [facility]: 314.2 Weight today: Pt declined Weight change: N/A  Pt reports nausea once and a little constipation. Pt states she has abdominal pain when drinking cold drinks and during her first drink of the day.   TANITA  BODY COMP RESULTS  02/22/2017   BMI (kg/m^2) Pt declined   Fat Mass (lbs)    Fat Free Mass (lbs)    Total Body Water (lbs)    The following the learning objectives were met by the patient during this course:  Identifies Phase 3A (Soft, High Proteins) Dietary Goals and will begin from 2 weeks post-operatively to 2 months post-operatively  Identifies appropriate sources of fluids and proteins   States protein recommendations and appropriate sources post-operatively  Identifies the need for appropriate texture modifications, mastication, and bite sizes when consuming solids  Identifies appropriate multivitamin and calcium sources post-operatively  Describes the need for physical activity post-operatively and will follow MD recommendations  States when to call healthcare provider regarding medication questions or post-operative complications  Handouts given during class include:  Phase 3A: Soft, High Protein Diet Handout  Follow-Up Plan: Patient will follow-up at [facility] in 6 weeks for 2 month post-op nutrition visit for diet advancement per MD.

## 2017-03-09 ENCOUNTER — Telehealth: Payer: Self-pay | Admitting: Registered"

## 2017-03-09 NOTE — Telephone Encounter (Signed)
Pt called to verify what she should be looking for on nutrition facts label when shopping bariatric surgery post-op options. RD reminded pt to keep fat/sugar in single digits, 15 grams or more of protein and 5 grams or less of carbohydrates for protein shake/drink/powder options.

## 2017-04-05 ENCOUNTER — Encounter: Payer: Self-pay | Admitting: Skilled Nursing Facility1

## 2017-04-05 ENCOUNTER — Encounter: Payer: BC Managed Care – PPO | Attending: Surgery | Admitting: Skilled Nursing Facility1

## 2017-04-05 DIAGNOSIS — Z6841 Body Mass Index (BMI) 40.0 and over, adult: Secondary | ICD-10-CM | POA: Insufficient documentation

## 2017-04-05 DIAGNOSIS — Z713 Dietary counseling and surveillance: Secondary | ICD-10-CM | POA: Diagnosis not present

## 2017-04-05 DIAGNOSIS — E669 Obesity, unspecified: Secondary | ICD-10-CM

## 2017-04-05 NOTE — Patient Instructions (Addendum)
-  Aim to get 64 fluid ounces not including the protein shakes  -Start with with soft cooked vegetables for 2 days and then when that goes well start in on raw vegetables  -Eat your protein first then start in on your vegetables

## 2017-04-05 NOTE — Progress Notes (Signed)
Weeks Post-Operative RYGB Surgery  Primary concerns today: Post-operative Bariatric Surgery Nutrition Management.  Pts responses: it was fine repeatedly or don't matter. Pt seems irritated and did not want to share what she eats or how she is doing. Pt states she is starting to feel hunger. Pt recognizes if she eats too fast or does not chew well she will gag on her foods. Pt states she struggled with the taste of water at first but now it is better. Pt states she is having bowel movement about every 3-4 days. Pt states she is getting More dry mouth. Pt states she Drinks water in vinegar.   Surgery date: 02/08/2017 Surgery type[facility]GB [facility] Weight today: 291.2  TANITA  BODY COMP RESULTS  04/05/2017   BMI (kg/m^2) 44.3   Fat Mass (lbs) 155.6   Fat Free Mass (lbs) 135.6   Total Body Water (lbs) 100.6   24-hr recall: B (AM): protein shake every morning Snk (AM): yogurt L (12PM): beans with [place] neck and eating the [place]key neck or chicken breast with chicken broth and seasonings or shrimp Snk (3 PM):  D (6 PM): 3 wings Snk (PM): 2 sugar free popcicles   Fluid intake: powerade zero, water, coffee, ice: 48-64 Estimated total protein intake: 60+  Medications: See List Supplementation: celebrate 2 in a day and tums (sometimes forgetting to take all 3)   Using straws: no Drinking while eating: no Having you been chewing well:no Chewing/swallowing difficulties: no Changes in vision: no Changes to mood/headaches: no Hair loss/Changes to skin/Changes to nails: no Any difficulty focusing or concentrating: no Sweating: no Dizziness/Lightheaded:  Palpitations: no  Carbonated beverages: no N/V/D/C/GAS: constipated Abdominal Pain: no Dumping syndrome: no  Recent physical activity:  [facility] 45 minutes   Progress Towards Goal(s):  In progress.  Handouts given during visit include:  Non-starchy vegetables    Nutritional Diagnosis:   [place]-3.3 Overweight/obesity related to past poor dietary habits and physical inactivity as evidenced by patient w/ recent RYGB surgery following dietary guidelines for continued weight loss. Intervention:  Nutrition counseling. Dietitian educated the pt on advancing her diet to include non-starchy vegetables. Goals: -Aim to get 64 fluid ounces not including the protein shakes -Start with with soft cooked vegetables for 2 days and then when that goes well start in on raw vegetables -Eat your protein first then start in on your vegetables   Teaching Method Utilized: Visual Auditory Hands on  Barriers to learning/adherence to lifestyle change: none identified   Demonstrated degree of understanding via:  Teach Back   Monitoring/Evaluation:  Dietary intake, exercise, and body weight.

## 2017-07-12 ENCOUNTER — Ambulatory Visit: Payer: BC Managed Care – PPO | Admitting: Skilled Nursing Facility1

## 2017-08-16 ENCOUNTER — Encounter: Payer: BC Managed Care – PPO | Attending: Surgery | Admitting: Skilled Nursing Facility1

## 2017-08-16 DIAGNOSIS — Z713 Dietary counseling and surveillance: Secondary | ICD-10-CM | POA: Insufficient documentation

## 2017-08-17 ENCOUNTER — Encounter: Payer: Self-pay | Admitting: Skilled Nursing Facility1

## 2017-08-17 NOTE — Progress Notes (Signed)
Follow-up visit:  Post-Operative RYGB Surgery  Medical Nutrition Therapy:  Appt start time: 6:00pm end time:  7:00pm  Primary concerns today: Post-operative Bariatric Surgery Nutrition Management 6 Month Post-Op Class  Pt seemed un-enthused with the class. Pt was verbally critically of another pt in class. Pt asked can I just never eat carbohydrates again: Dietitian educated the class on this topic and pt seemed to be more vexed after that point. Pt then left about 30 minutes into the class.   Surgery date: 02/08/2017 Surgery [facility]GB [facility] Weight today: pt declined  TANITA  BODY COMP RESULTS  04/05/2017   BMI (kg/m^2) 44.3   Fat Mass (lbs) 155.6   Fat Free Mass (lbs) 135.6   Total Body Water (lbs) 100.6    Information Reviewed/ Discussed During Appointment: -Review of composition scale numbers -Fluid requirements (64-100 ounces) -Protein requirements (60-80g) -Strategies for tolerating diet -Advancement of diet to include Starchy vegetables -Barriers to inclusion of new foods -Inclusion of appropriate multivitamin and calcium supplements  -Exercise recommendations   Fluid intake: 64+  Medications: see list Supplementation: unknown type Using straws: no Drinking while eating: no Having you been chewing well:no Chewing/swallowing difficulties: no Changes in vision: no Changes to mood/headaches: ono Hair loss/Cahnges to skin/Changes to nails: no Any difficulty focusing or concentrating: no Sweating: no Dizziness/Lightheaded: no Palpitations: no  Carbonated beverages: no N/V/D/C/GAS: no Abdominal Pain: no Dumping syndrome: no  Recent physical activity:  unknown  Progress Towards Goal(s):  In progress.  Handouts given during visit include:  Phase V diet Progression   Goals Sheet  The Benefits of Exercise are endless.....  Support Group Topics  Pt Chosen Goals:  Pt did not stay in class to complete this.  Teaching Method  Utilized:  Visual Auditory Hands on   Demonstrated degree of understanding via:  Teach Back   Monitoring/Evaluation:  Dietary intake, exercise, and body weight. Follow up in 3 months for 9 month post-op visit.

## 2017-11-16 ENCOUNTER — Ambulatory Visit: Payer: BC Managed Care – PPO | Admitting: Registered"

## 2017-11-16 ENCOUNTER — Ambulatory Visit: Payer: BC Managed Care – PPO | Admitting: Skilled Nursing Facility1

## 2017-11-17 ENCOUNTER — Encounter: Payer: BC Managed Care – PPO | Attending: Surgery | Admitting: Registered"

## 2017-11-17 ENCOUNTER — Encounter: Payer: Self-pay | Admitting: Registered"

## 2017-11-17 DIAGNOSIS — E669 Obesity, unspecified: Secondary | ICD-10-CM

## 2017-11-17 DIAGNOSIS — Z713 Dietary counseling and surveillance: Secondary | ICD-10-CM | POA: Diagnosis not present

## 2017-11-17 NOTE — Progress Notes (Signed)
Follow-up visit:  9 Months Post-Operative RYGB Surgery  Medical Nutrition Therapy:  Appt start time: 9:40 end time:  10:05.  Primary concerns today: Post-operative Bariatric Surgery Nutrition Management.  Non scale victories: none stated  Surgery date: 02/08/2017 Surgery type: RYGB [facility] weight at [facility]: 314.2 Weight today: pt declined Weight loss goal: improve joint pain in knees and back, wants to lose weight and maintain, prevent back surgery   TANITA  BODY COMP RESULTS  04/05/2017   BMI (kg/m^2) 44.3   Fat Mass (lbs) 155.6   Fat Free Mass (lbs) 135.6   Total Body Water (lbs) 100.6    Pt states she has been snacking more. Pt states she feels herself getting tired therefore started taking another multivitamin. Pt states she is trying to lose 8 lbs more so that she can be happy. Pt states she is planning to go back to the gym next week. Pt reports working a lot lately. Pt states she has met goal for knee replacements, has been approved for surgery. Pt states she plans to have knee surgery in summer of 2020 when work is less busy. Pt states she is eating a variety of vegetables and trying new things. Pt states she is preparing her food by air frying, baking, or using crock pot. Pt states sweet potatoes weigh her down. Pt states she adds corn and peas to vegetable soup for starchy vegetables.    Preferred Learning Style:   No preference indicated   Learning Readiness:   Ready  Change in progress  24-hr recall: B (AM): protein shake (30g) Snk (9 AM): greek yogurt (12-15g) or nuts or frozen grapes Snk (11 AM): frozen grapes L (PM): [place] chop (21g) + 5 brussels sprouts Snk (PM): raw vegetables + popcorn  D (PM):  [place] chop (21g) + 5 brussels sprouts Snk (PM): popcorn + popsicle  Fluid intake: water (48 oz), Ice drink (20 oz), G2 (12 oz); 64+ oz Estimated total protein intake: 60+ grams  Medications: See list Supplementation: Celebrate chewable + 3 TUMS  Using straws:  no Drinking while eating: no Having you been chewing well: yes Chewing/swallowing difficulties: no Changes in vision: no Changes to mood/headaches: no Hair loss/Changes to skin/Changes to nails: no, no, no Any difficulty focusing or concentrating: no Sweating: no Dizziness/Lightheaded: no Palpitations: no  Carbonated beverages:  N/V/D/C/GAS: no, no, no, no, no Abdominal Pain: once Dumping syndrome: no Last Lap-Band fill: N/A  Recent physical activity:  None stated  Progress Towards Goal(s):  In progress.  Handouts given during visit include:  Phase VI: Protein + vegetables + fruit    Nutritional Diagnosis:  [place]-3.3 Overweight/obesity related to past poor dietary habits and physical inactivity as evidenced by patient w/ recent RYGB surgery following dietary guidelines for continued weight loss.     Intervention:  Nutrition education and counseling. Pt was educated and counseled on the next phase of the diet. Pt was in agreement with goals listed.  Goals: - Add protein to fruit when snacking. - Aim to increase physical activity to at least 2-3 days/week.   Teaching Method Utilized:  Visual Auditory Hands on  Barriers to learning/adherence to lifestyle change: none identified  Demonstrated degree of understanding via:  Teach Back   Monitoring/Evaluation:  Dietary intake, exercise, lap band fills, and body weight. Follow up in 3 months for 12 month post-op visit.

## 2017-11-17 NOTE — Patient Instructions (Addendum)
-   Add protein to fruit when snacking.  - Aim to increase physical activity to at least 2-3 days/week.

## 2017-12-15 ENCOUNTER — Emergency Department (HOSPITAL_BASED_OUTPATIENT_CLINIC_OR_DEPARTMENT_OTHER)
Admission: EM | Admit: 2017-12-15 | Discharge: 2017-12-15 | Disposition: A | Discharge status: Home / Self Care | Payer: BC Managed Care – PPO | Attending: Emergency Medicine | Admitting: Emergency Medicine

## 2017-12-15 ENCOUNTER — Other Ambulatory Visit: Payer: Self-pay

## 2017-12-15 ENCOUNTER — Encounter (HOSPITAL_BASED_OUTPATIENT_CLINIC_OR_DEPARTMENT_OTHER): Payer: Self-pay | Admitting: *Deleted

## 2017-12-15 DIAGNOSIS — M79662 Pain in left lower leg: Secondary | ICD-10-CM | POA: Insufficient documentation

## 2017-12-15 DIAGNOSIS — Z87891 Personal history of nicotine dependence: Secondary | ICD-10-CM | POA: Diagnosis not present

## 2017-12-15 DIAGNOSIS — Y92410 Unspecified street and highway as the place of occurrence of the external cause: Secondary | ICD-10-CM | POA: Diagnosis not present

## 2017-12-15 DIAGNOSIS — Z79899 Other long term (current) drug therapy: Secondary | ICD-10-CM | POA: Diagnosis not present

## 2017-12-15 NOTE — Discharge Instructions (Addendum)
°  Please continue to use your flexeril and tramadol for muscle spasm and pain. Add tylenol [phone] mg for pain every 6 hours as needed.
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
## 2018-02-20 ENCOUNTER — Ambulatory Visit: Payer: BC Managed Care – PPO | Admitting: Skilled Nursing Facility1

## 2019-03-26 ENCOUNTER — Emergency Department (HOSPITAL_BASED_OUTPATIENT_CLINIC_OR_DEPARTMENT_OTHER): Payer: BC Managed Care – PPO

## 2019-03-26 ENCOUNTER — Emergency Department (HOSPITAL_BASED_OUTPATIENT_CLINIC_OR_DEPARTMENT_OTHER)
Admission: EM | Admit: 2019-03-26 | Discharge: 2019-03-26 | Disposition: A | Discharge status: Home / Self Care | Payer: BC Managed Care – PPO | Attending: Emergency Medicine | Admitting: Emergency Medicine

## 2019-03-26 ENCOUNTER — Other Ambulatory Visit: Payer: Self-pay

## 2019-03-26 ENCOUNTER — Encounter (HOSPITAL_BASED_OUTPATIENT_CLINIC_OR_DEPARTMENT_OTHER): Payer: Self-pay

## 2019-03-26 DIAGNOSIS — Z87891 Personal history of nicotine dependence: Secondary | ICD-10-CM | POA: Insufficient documentation

## 2019-03-26 DIAGNOSIS — S0990XA Unspecified injury of head, initial encounter: Secondary | ICD-10-CM | POA: Diagnosis not present

## 2019-03-26 DIAGNOSIS — S161XXA Strain of muscle, fascia and tendon at neck level, initial encounter: Secondary | ICD-10-CM | POA: Diagnosis not present

## 2019-03-26 DIAGNOSIS — Y9289 Other specified places as the place of occurrence of the external cause: Secondary | ICD-10-CM | POA: Diagnosis not present

## 2019-03-26 DIAGNOSIS — Y9389 Activity, other specified: Secondary | ICD-10-CM | POA: Diagnosis not present

## 2019-03-26 DIAGNOSIS — Z79899 Other long term (current) drug therapy: Secondary | ICD-10-CM | POA: Diagnosis not present

## 2019-03-26 DIAGNOSIS — Y999 Unspecified external cause status: Secondary | ICD-10-CM | POA: Diagnosis not present

## 2019-03-26 MED ORDER — HYDROCODONE-ACETAMINOPHEN 5-325 MG PO TABS: 1.0000 | ORAL_TABLET | Freq: Four times a day (QID) | ORAL | 0 refills | Status: AC | PRN
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
## 2019-03-26 NOTE — ED Notes (Signed)
Attempted to call report, RN unaware she was getting admission and asked for me to call back in 5 minutes.

## 2019-03-26 NOTE — Discharge Instructions (Signed)
Follow-up with sports medicine if symptoms do not resolve over the next few days.  Take the hydrocodone as needed for pain.  Work note provided to be out of work for the next 2 days.  X-rays and CT scans without any significant findings here tonight.
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
## 2019-08-31 ENCOUNTER — Encounter (HOSPITAL_COMMUNITY): Payer: Self-pay

## 2019-11-21 ENCOUNTER — Other Ambulatory Visit: Payer: Self-pay

## 2019-11-21 ENCOUNTER — Encounter: Payer: BC Managed Care – PPO | Attending: Surgery | Admitting: Skilled Nursing Facility1

## 2019-11-21 ENCOUNTER — Encounter: Payer: Self-pay | Admitting: Skilled Nursing Facility1

## 2019-11-21 DIAGNOSIS — E669 Obesity, unspecified: Secondary | ICD-10-CM | POA: Diagnosis present
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
## 2020-03-31 ENCOUNTER — Other Ambulatory Visit: Payer: Self-pay | Admitting: Orthopedic Surgery

## 2020-03-31 DIAGNOSIS — M542 Cervicalgia: Secondary | ICD-10-CM

## 2020-04-18 ENCOUNTER — Other Ambulatory Visit: Payer: BC Managed Care – PPO

## 2020-04-21 ENCOUNTER — Other Ambulatory Visit: Payer: BC Managed Care – PPO

## 2020-04-22 ENCOUNTER — Ambulatory Visit
Admission: RE | Admit: 2020-04-22 | Discharge: 2020-04-22 | Disposition: A | Discharge status: Home / Self Care | Payer: BC Managed Care – PPO | Source: Ambulatory Visit | Attending: Orthopedic Surgery | Admitting: Orthopedic Surgery

## 2020-04-22 ENCOUNTER — Other Ambulatory Visit: Payer: Self-pay

## 2020-04-22 DIAGNOSIS — M542 Cervicalgia: Secondary | ICD-10-CM
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
## 2021-08-28 ENCOUNTER — Encounter (HOSPITAL_COMMUNITY): Payer: Self-pay | Admitting: *Deleted

## 2022-09-09 ENCOUNTER — Encounter (HOSPITAL_COMMUNITY): Payer: Self-pay | Admitting: *Deleted
